# Patient Record
Sex: Female | Born: 1995
Health system: Southern US, Academic
[De-identification: ages and names within clinical notes are randomized; demographics above are authoritative.]

## PROBLEM LIST (undated history)

## (undated) ENCOUNTER — Encounter: Attending: Psychiatry | Primary: Psychiatry

## (undated) ENCOUNTER — Telehealth

## (undated) ENCOUNTER — Telehealth
Attending: Student in an Organized Health Care Education/Training Program | Primary: Student in an Organized Health Care Education/Training Program

## (undated) ENCOUNTER — Encounter

## (undated) ENCOUNTER — Ambulatory Visit: Attending: Addiction (Substance Use Disorder) | Primary: Addiction (Substance Use Disorder)

## (undated) ENCOUNTER — Ambulatory Visit

## (undated) ENCOUNTER — Telehealth
Attending: Pharmacist Clinician (PhC)/ Clinical Pharmacy Specialist | Primary: Pharmacist Clinician (PhC)/ Clinical Pharmacy Specialist

## (undated) ENCOUNTER — Encounter
Attending: Student in an Organized Health Care Education/Training Program | Primary: Student in an Organized Health Care Education/Training Program

## (undated) ENCOUNTER — Ambulatory Visit: Payer: MEDICAID | Attending: Family | Primary: Family

## (undated) ENCOUNTER — Encounter: Attending: Family Medicine | Primary: Family Medicine

## (undated) ENCOUNTER — Encounter: Attending: Family | Primary: Family

## (undated) ENCOUNTER — Telehealth: Attending: Family | Primary: Family

## (undated) ENCOUNTER — Ambulatory Visit: Payer: MEDICAID

## (undated) ENCOUNTER — Telehealth: Attending: Psychiatry | Primary: Psychiatry

## (undated) ENCOUNTER — Encounter
Attending: Pharmacist Clinician (PhC)/ Clinical Pharmacy Specialist | Primary: Pharmacist Clinician (PhC)/ Clinical Pharmacy Specialist

## (undated) ENCOUNTER — Encounter: Attending: Gastroenterology | Primary: Gastroenterology

## (undated) DIAGNOSIS — Z789 Other specified health status: Secondary | ICD-10-CM

## (undated) HISTORY — PX: NO PAST SURGERIES: SHX2092

---
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
## 2017-01-30 ENCOUNTER — Emergency Department
Admission: EM | Admit: 2017-01-30 | Discharge: 2017-01-30 | Disposition: A | Discharge status: Home / Self Care | Source: Intra-hospital

## 2017-01-30 MED ORDER — SULFAMETHOXAZOLE 800 MG-TRIMETHOPRIM 160 MG TABLET
ORAL_TABLET | Freq: Two times a day (BID) | ORAL | 0 refills | 0 days | Status: CP
Start: 2017-01-30 — End: 2017-02-09

## 2017-01-30 MED ORDER — IBUPROFEN 600 MG TABLET
ORAL_TABLET | Freq: Four times a day (QID) | ORAL | 0 refills | 0.00000 days | Status: CP | PRN
Start: 2017-01-30 — End: 2017-02-06

## 2017-02-02 ENCOUNTER — Emergency Department
Admission: EM | Admit: 2017-02-02 | Discharge: 2017-02-02 | Disposition: A | Discharge status: Home / Self Care | Source: Intra-hospital

## 2017-02-02 MED ORDER — DOXYCYCLINE HYCLATE 100 MG CAPSULE
ORAL_CAPSULE | Freq: Two times a day (BID) | ORAL | 0 refills | 0.00000 days | Status: CP
Start: 2017-02-02 — End: 2017-02-02

## 2017-02-02 MED ORDER — FLUCONAZOLE 200 MG TABLET: 200 mg | tablet | Freq: Once | 0 refills | 0 days | Status: AC

## 2017-02-02 MED ORDER — DOXYCYCLINE HYCLATE 100 MG CAPSULE: 100 mg | capsule | Freq: Two times a day (BID) | 0 refills | 0 days | Status: AC

## 2017-02-02 MED ORDER — FLUCONAZOLE 200 MG TABLET
ORAL_TABLET | Freq: Once | ORAL | 0 refills | 0.00000 days | Status: CP
Start: 2017-02-02 — End: 2017-02-02

## 2017-02-03 MED ORDER — DOXYCYCLINE HYCLATE 100 MG CAPSULE: 100 mg | capsule | 0 refills | 0 days

## 2017-02-03 MED ORDER — FLUCONAZOLE 200 MG TABLET
ORAL_TABLET | Freq: Every day | ORAL | 0 refills | 0 days | Status: CP
Start: 2017-02-03 — End: 2017-02-03

## 2017-02-03 MED ORDER — DOXYCYCLINE HYCLATE 100 MG CAPSULE
ORAL_CAPSULE | Freq: Two times a day (BID) | ORAL | 0 refills | 0.00000 days | Status: CP
Start: 2017-02-03 — End: 2018-02-03

## 2017-02-03 MED FILL — DOXYCYCLINE/100MG/CAP: DOXYCYCLINE/100MG/CAP | 10 days supply | Qty: 20 | Fill #0

## 2017-02-04 ENCOUNTER — Emergency Department
Admission: EM | Admit: 2017-02-04 | Discharge: 2017-02-04 | Disposition: A | Discharge status: Home / Self Care | Source: Intra-hospital

## 2017-03-29 ENCOUNTER — Emergency Department
Admission: EM | Admit: 2017-03-29 | Discharge: 2017-03-29 | Disposition: A | Discharge status: Home / Self Care | Source: Intra-hospital | Attending: Emergency Medicine | Admitting: Emergency Medicine

## 2017-03-29 MED ORDER — SULFAMETHOXAZOLE 800 MG-TRIMETHOPRIM 160 MG TABLET: 1 | tablet | Freq: Two times a day (BID) | 0 refills | 0 days | Status: AC

## 2017-03-29 MED ORDER — SULFAMETHOXAZOLE 800 MG-TRIMETHOPRIM 160 MG TABLET
ORAL_TABLET | Freq: Two times a day (BID) | ORAL | 0 refills | 0.00000 days | Status: CP
Start: 2017-03-29 — End: 2017-04-08

## 2019-11-01 ENCOUNTER — Other Ambulatory Visit: Payer: Self-pay

## 2019-11-01 ENCOUNTER — Observation Stay: Payer: Medicaid Other

## 2019-11-01 ENCOUNTER — Inpatient Hospital Stay
Admission: EM | Admit: 2019-11-01 | Discharge: 2019-11-03 | DRG: 832 | Discharge status: Against Medical Advice | Payer: Medicaid Other | Attending: Obstetrics and Gynecology | Admitting: Obstetrics and Gynecology

## 2019-11-01 ENCOUNTER — Encounter: Payer: Self-pay | Admitting: Obstetrics and Gynecology

## 2019-11-01 DIAGNOSIS — O99322 Drug use complicating pregnancy, second trimester: Secondary | ICD-10-CM

## 2019-11-01 DIAGNOSIS — O99012 Anemia complicating pregnancy, second trimester: Secondary | ICD-10-CM | POA: Diagnosis present

## 2019-11-01 DIAGNOSIS — N1330 Unspecified hydronephrosis: Secondary | ICD-10-CM | POA: Diagnosis not present

## 2019-11-01 DIAGNOSIS — R10A1 Flank pain, right side: Secondary | ICD-10-CM | POA: Diagnosis present

## 2019-11-01 DIAGNOSIS — N390 Urinary tract infection, site not specified: Secondary | ICD-10-CM | POA: Diagnosis present

## 2019-11-01 DIAGNOSIS — E876 Hypokalemia: Secondary | ICD-10-CM | POA: Diagnosis not present

## 2019-11-01 DIAGNOSIS — R7989 Other specified abnormal findings of blood chemistry: Secondary | ICD-10-CM | POA: Diagnosis present

## 2019-11-01 DIAGNOSIS — N3000 Acute cystitis without hematuria: Secondary | ICD-10-CM | POA: Diagnosis not present

## 2019-11-01 DIAGNOSIS — Z5329 Procedure and treatment not carried out because of patient's decision for other reasons: Secondary | ICD-10-CM | POA: Diagnosis present

## 2019-11-01 DIAGNOSIS — O99282 Endocrine, nutritional and metabolic diseases complicating pregnancy, second trimester: Secondary | ICD-10-CM | POA: Diagnosis present

## 2019-11-01 DIAGNOSIS — F17210 Nicotine dependence, cigarettes, uncomplicated: Secondary | ICD-10-CM | POA: Diagnosis present

## 2019-11-01 DIAGNOSIS — D649 Anemia, unspecified: Secondary | ICD-10-CM | POA: Diagnosis present

## 2019-11-01 DIAGNOSIS — R17 Unspecified jaundice: Secondary | ICD-10-CM | POA: Diagnosis not present

## 2019-11-01 DIAGNOSIS — M545 Low back pain: Secondary | ICD-10-CM | POA: Diagnosis present

## 2019-11-01 DIAGNOSIS — O2342 Unspecified infection of urinary tract in pregnancy, second trimester: Principal | ICD-10-CM | POA: Diagnosis present

## 2019-11-01 DIAGNOSIS — Z3A26 26 weeks gestation of pregnancy: Secondary | ICD-10-CM

## 2019-11-01 DIAGNOSIS — F1123 Opioid dependence with withdrawal: Secondary | ICD-10-CM | POA: Diagnosis present

## 2019-11-01 DIAGNOSIS — F1990 Other psychoactive substance use, unspecified, uncomplicated: Secondary | ICD-10-CM

## 2019-11-01 DIAGNOSIS — R109 Unspecified abdominal pain: Secondary | ICD-10-CM | POA: Diagnosis present

## 2019-11-01 DIAGNOSIS — O26899 Other specified pregnancy related conditions, unspecified trimester: Secondary | ICD-10-CM | POA: Diagnosis present

## 2019-11-01 DIAGNOSIS — O99891 Other specified diseases and conditions complicating pregnancy: Secondary | ICD-10-CM | POA: Diagnosis present

## 2019-11-01 DIAGNOSIS — R945 Abnormal results of liver function studies: Secondary | ICD-10-CM | POA: Diagnosis present

## 2019-11-01 DIAGNOSIS — O99332 Smoking (tobacco) complicating pregnancy, second trimester: Secondary | ICD-10-CM | POA: Diagnosis present

## 2019-11-01 DIAGNOSIS — Z20822 Contact with and (suspected) exposure to covid-19: Secondary | ICD-10-CM | POA: Diagnosis present

## 2019-11-01 DIAGNOSIS — Z3490 Encounter for supervision of normal pregnancy, unspecified, unspecified trimester: Secondary | ICD-10-CM

## 2019-11-01 HISTORY — DX: Other specified health status: Z78.9

## 2019-11-01 LAB — CBC
HCT: 29.4 % — ABNORMAL LOW (ref 36.0–46.0)
Hemoglobin: 10 g/dL — ABNORMAL LOW (ref 12.0–15.0)
MCH: 27.9 pg (ref 26.0–34.0)
MCHC: 34 g/dL (ref 30.0–36.0)
MCV: 81.9 fL (ref 80.0–100.0)
Platelets: 292 10*3/uL (ref 150–400)
RBC: 3.59 MIL/uL — ABNORMAL LOW (ref 3.87–5.11)
RDW: 15.5 % (ref 11.5–15.5)
WBC: 13.6 10*3/uL — ABNORMAL HIGH (ref 4.0–10.5)
nRBC: 0 % (ref 0.0–0.2)

## 2019-11-01 LAB — RESPIRATORY PANEL BY RT PCR (FLU A&B, COVID)
Influenza A by PCR: NEGATIVE
Influenza B by PCR: NEGATIVE
SARS Coronavirus 2 by RT PCR: NEGATIVE

## 2019-11-01 LAB — COMPREHENSIVE METABOLIC PANEL
ALT: 95 U/L — ABNORMAL HIGH (ref 0–44)
AST: 113 U/L — ABNORMAL HIGH (ref 15–41)
Albumin: 2.4 g/dL — ABNORMAL LOW (ref 3.5–5.0)
Alkaline Phosphatase: 178 U/L — ABNORMAL HIGH (ref 38–126)
Anion gap: 10 (ref 5–15)
BUN: <5 mg/dL — ABNORMAL LOW (ref 6–20)
CO2: 23 mmol/L (ref 22–32)
Calcium: 8.3 mg/dL — ABNORMAL LOW (ref 8.9–10.3)
Chloride: 104 mmol/L (ref 98–111)
Creatinine, Ser: 0.47 mg/dL (ref 0.44–1.00)
GFR calc Af Amer: >60 mL/min (ref >60)
GFR calc non Af Amer: >60 mL/min (ref >60)
Glucose, Bld: 93 mg/dL (ref 70–99)
Potassium: 3.4 mmol/L — ABNORMAL LOW (ref 3.5–5.1)
Sodium: 137 mmol/L (ref 135–145)
Total Bilirubin: 6.8 mg/dL — ABNORMAL HIGH (ref 0.3–1.2)
Total Protein: 6.1 g/dL — ABNORMAL LOW (ref 6.5–8.1)

## 2019-11-01 LAB — URINALYSIS, COMPLETE (UACMP) WITH MICROSCOPIC
Glucose, UA: NEGATIVE mg/dL
Hgb urine dipstick: NEGATIVE
Ketones, ur: NEGATIVE mg/dL
Nitrite: NEGATIVE
Protein, ur: NEGATIVE mg/dL
Specific Gravity, Urine: 1.009 (ref 1.005–1.030)
WBC, UA: >50 WBC/hpf — ABNORMAL HIGH (ref 0–5)
pH: 6 (ref 5.0–8.0)

## 2019-11-01 LAB — URINE DRUG SCREEN, QUALITATIVE (ARMC ONLY)
Amphetamines, Ur Screen: POSITIVE — AB
Barbiturates, Ur Screen: NOT DETECTED
Benzodiazepine, Ur Scrn: NOT DETECTED
Cannabinoid 50 Ng, Ur ~~LOC~~: NOT DETECTED
Cocaine Metabolite,Ur ~~LOC~~: NOT DETECTED
MDMA (Ecstasy)Ur Screen: NOT DETECTED
Methadone Scn, Ur: POSITIVE — AB
Opiate, Ur Screen: NOT DETECTED
Phencyclidine (PCP) Ur S: NOT DETECTED
Tricyclic, Ur Screen: NOT DETECTED

## 2019-11-01 LAB — RAPID HIV SCREEN (HIV 1/2 AB+AG)
HIV 1/2 Antibodies: NONREACTIVE
HIV-1 P24 Antigen - HIV24: NONREACTIVE

## 2019-11-01 LAB — BILIRUBIN, FRACTIONATED(TOT/DIR/INDIR)
Bilirubin, Direct: 4.3 mg/dL — ABNORMAL HIGH (ref 0.0–0.2)
Indirect Bilirubin: 2.7 mg/dL — ABNORMAL HIGH (ref 0.3–0.9)
Total Bilirubin: 7 mg/dL — ABNORMAL HIGH (ref 0.3–1.2)

## 2019-11-01 LAB — PROTIME-INR
INR: 1.1 (ref 0.8–1.2)
Prothrombin Time: 13.9 seconds (ref 11.4–15.2)

## 2019-11-01 LAB — LIPASE, BLOOD: Lipase: 19 U/L (ref 11–51)

## 2019-11-01 LAB — AMYLASE: Amylase: 41 U/L (ref 28–100)

## 2019-11-01 MED ORDER — FERROUS SULFATE 325 (65 FE) MG PO TABS
325.0000 mg | ORAL_TABLET | Freq: Every day | ORAL | Status: DC
  Administered 2019-11-02 – 2019-11-03 (×2): 325 mg via ORAL
  Filled 2019-11-01 (×2): qty 1

## 2019-11-01 MED ORDER — SODIUM CHLORIDE 0.9 % IV SOLN
INTRAVENOUS | Status: DC
  Administered 2019-11-03: 1000 mL via INTRAVENOUS

## 2019-11-01 MED ORDER — CYCLOBENZAPRINE HCL 10 MG PO TABS
5.0000 mg | ORAL_TABLET | Freq: Three times a day (TID) | ORAL | Status: DC | PRN
  Administered 2019-11-01 – 2019-11-03 (×5): 5 mg via ORAL
  Filled 2019-11-01 (×6): qty 0.5

## 2019-11-01 MED ORDER — LACTATED RINGERS IV BOLUS
1000.0000 mL | Freq: Once | INTRAVENOUS | Status: AC
  Administered 2019-11-01: 1000 mL via INTRAVENOUS

## 2019-11-01 MED ORDER — ONDANSETRON HCL 4 MG/2ML IJ SOLN: 4.0000 mg | Freq: Three times a day (TID) | INTRAMUSCULAR | Status: DC | PRN

## 2019-11-01 MED ORDER — TAMSULOSIN HCL 0.4 MG PO CAPS
0.4000 mg | ORAL_CAPSULE | Freq: Every day | ORAL | Status: DC
  Administered 2019-11-01 – 2019-11-03 (×3): 0.4 mg via ORAL
  Filled 2019-11-01 (×3): qty 1

## 2019-11-01 MED ORDER — METHADONE HCL 5 MG PO TABS
32.5000 mg | ORAL_TABLET | Freq: Every day | ORAL | Status: DC
  Administered 2019-11-02 – 2019-11-03 (×2): 32.5 mg via ORAL
  Filled 2019-11-01 (×2): qty 1

## 2019-11-01 MED ORDER — SODIUM CHLORIDE 0.9 % IV SOLN
1.0000 g | INTRAVENOUS | Status: DC
  Administered 2019-11-01 – 2019-11-03 (×3): 1 g via INTRAVENOUS
  Filled 2019-11-01: qty 1
  Filled 2019-11-01: qty 10
  Filled 2019-11-01: qty 1
  Filled 2019-11-01: qty 10

## 2019-11-01 MED ORDER — ACETAMINOPHEN 325 MG PO TABS: 650.0000 mg | ORAL_TABLET | ORAL | Status: DC | PRN

## 2019-11-01 MED ORDER — POTASSIUM CHLORIDE CRYS ER 10 MEQ PO TBCR
20.0000 meq | EXTENDED_RELEASE_TABLET | Freq: Once | ORAL | Status: AC
  Administered 2019-11-01: 20 meq via ORAL
  Filled 2019-11-01: qty 2

## 2019-11-01 MED ORDER — NICOTINE 21 MG/24HR TD PT24
21.0000 mg | MEDICATED_PATCH | Freq: Every day | TRANSDERMAL | Status: DC
  Administered 2019-11-01 – 2019-11-02 (×2): 21 mg via TRANSDERMAL
  Filled 2019-11-01 (×2): qty 1

## 2019-11-01 MED ORDER — SODIUM CHLORIDE 0.9 % IV SOLN
1.0000 g | Freq: Once | INTRAVENOUS | Status: DC
  Filled 2019-11-01: qty 10

## 2019-11-01 MED ORDER — PRENATAL MULTIVITAMIN CH
1.0000 | ORAL_TABLET | Freq: Every day | ORAL | Status: DC
  Administered 2019-11-02 – 2019-11-03 (×2): 1 via ORAL
  Filled 2019-11-01 (×2): qty 1

## 2019-11-01 NOTE — Progress Notes (Signed)
Patient went down to MRI to have MRCP performed as recommended by GI consult. She reports that almost immediately, she became claustrophobic and the scan was abandoned. She reports that she felt incredibly anxious. Discussed that we have the option to provide premedication with something that will help her to feel more calm and sleepy. She prefers to defer the scan for now. If as her labs begin to result, GI still feels that the scan would be helpful, she is open to trying again with premedication.   Discussed that we would try to avoid benzodiazepenes for premedication, given her recent IV drug use and stated desire to get and stay clean. Could use diphenhydramine 50mg  IV and/or Phenergan 25mg  IV for premedication.   , 11/01/2019 9:07 PM

## 2019-11-01 NOTE — Consult Note (Signed)
Medical Consultation   Glenda Schwartz  PJK:[id number]  DOB: 1995-08-18  DOA: 11/01/2019  PCP: Patient, No Pcp Per  Outpatient Specialists:    Requesting physician: OB/Gyn: CNM, Ian Bushman  Reason for consultation: -Right flank pain, possible UTI, jaundice  History of Present Illness: Glenda Schwartz is an 24 y.o. female tobacco abuse, IV drug abuse, methadone use, 25-week pregnancy, who presents with right flan pain and jaundice.  Pt is 25 weeks of pregnancy.  Patient states that she has been having right-sided flank pain in the past 3 days, which is constant, sharp, 6 out of 10 severity, constant, nonradiating.  She has had dark urine, but no symptoms of UTI.  No hematuria.  She has nausea and vomited once yesterday, but no vomiting today.  She had loose stool bowel movement yesterday, but no diarrhea today.  Denies abdominal pain.  Patient does not have chest pain, cough, shortness of breath.  No fever or chills.  No vaginal bleeding or vaginal discharge.  She feels like her baby is normal. She state that her stool is lighter in color recently.  Her eyes look yellow. She has hx of IV drug use.  She states that she goes to [facility] treatment center for methadone treatment.  Lab: WBC 13.6, urinalysis (cloudy appearance, large amount of leukocyte, many bacteria, WBC>59), lipase 19, UDS positive for methadone and amphetamine, amylase 41, negative HIV antibody, pending COVID-19 PCR, potassium 3.4, renal function okay, abnormal liver function (ALP 178, AST 113, ALT 95, total bilirubin 7.0, direct bilirubin 4.3), platelet 292, temperature normal, blood pressure 108/74, heart rate 86, RR 18.  # US-renal:  1. Moderate hydronephrosis on the right. Slight hydronephrosis on the left. Proximal ureterectasis noted on the right. No focal obstructing ureteral lesions evident. Fullness of the collecting systems in this circumstance could be due to impression from the intrauterine  gestation. Obstructing calculus, particular on the right, cannot be excluded on this study. 2.  Nonobstructing 7 mm calculus mid to lower right kidney.  # US-RUQ: Negative.  No hepatobiliary abnormality identified.  Review of Systems:  General: no fevers, chills, no changes in body weight, no changes in appetite. Has yellow eyes Skin: no rash HEENT: no blurry vision, hearing changes or sore throat Pulm: no dyspnea, coughing, wheezing CV: no chest pain, palpitations, shortness of breath Abd: had nausea, vomiting and loose stool bowel movement, no abdominal pain or constipation GU: no dysuria, hematuria, polyuria Ext: no arthralgias, myalgias Musculoskeletal: Has right flank pain Neuro: no weakness, numbness, or tingling  Past Medical History: Past Medical History:  Diagnosis Date  . Medical history non-contributory     Past Surgical History: Past Surgical History:  Procedure Laterality Date  . NO PAST SURGERIES       Allergies:  No Known Allergies   Social History:  reports that she has been smoking cigarettes. She has been smoking about 0.50 packs per day. She has never used smokeless tobacco. No history on file for alcohol and drug.   Family History: Family History  Problem Relation Age of Onset  . Anxiety disorder Mother   . Anxiety disorder Father    Physical Exam: Vitals:   11/01/19 1107 11/01/19 1119  BP: 108/74   Pulse: 86   Resp: 18   Temp: 98 F (36.7 C)   TempSrc: Oral   Weight:  59 kg  Height:  5\' 2"  (1.575 m)  General: Not in acute distress HEENT: PERRL, EOMI, has scleral icterus, No JVD or bruit Cardiac: S1/S2, RRR, No murmurs, gallops or rubs Pulm: No rales, wheezing, rhonchi or rubs. Abd: Soft, nontender, no rebound pain, no organomegaly, BS present. 25 week pregnancy. Ext: No edema. 2+DP/PT pulse bilaterally GU: has right CVA tenderness Musculoskeletal: No joint deformities, erythema, or stiffness, ROM full Skin: No rashes.  Neuro:  Alert and oriented X3, cranial nerves II-XII grossly intact, moves all extremities normally Psych: Patient is not psychotic, no suicidal or hemocidal ideation.   Data reviewed:  I have personally reviewed following labs and imaging studies Labs:  CBC: Recent Labs  Lab 11/01/19 1144  WBC 13.6*  HGB 10.0*  HCT 29.4*  MCV 81.9  PLT 292    Basic Metabolic Panel: Recent Labs  Lab 11/01/19 1144  NA 137  K 3.4*  CL 104  CO2 23  GLUCOSE 93  BUN <5*  CREATININE 0.47  CALCIUM 8.3*   GFR Estimated Creatinine Clearance: 84.3 mL/min (by C-G formula based on SCr of 0.47 mg/dL). Liver Function Tests: Recent Labs  Lab 11/01/19 1143 11/01/19 1144  AST  --  113*  ALT  --  95*  ALKPHOS  --  178*  BILITOT 7.0* 6.8*  PROT  --  6.1*  ALBUMIN  --  2.4*   Recent Labs  Lab 11/01/19 1143  LIPASE 19  AMYLASE 41   No results for input(s): AMMONIA in the last 168 hours. Coagulation profile Recent Labs  Lab 11/01/19 1144  INR 1.1    Cardiac Enzymes: No results for input(s): CKTOTAL, CKMB, CKMBINDEX, TROPONINI in the last 168 hours. BNP: Invalid input(s): POCBNP CBG: No results for input(s): GLUCAP in the last 168 hours. D-Dimer No results for input(s): DDIMER in the last 72 hours. Hgb A1c No results for input(s): HGBA1C in the last 72 hours. Lipid Profile No results for input(s): CHOL, HDL, LDLCALC, TRIG, CHOLHDL, LDLDIRECT in the last 72 hours. Thyroid function studies No results for input(s): TSH, T4TOTAL, T3FREE, THYROIDAB in the last 72 hours.  Invalid input(s): FREET3 Anemia work up No results for input(s): VITAMINB12, FOLATE, FERRITIN, TIBC, IRON, RETICCTPCT in the last 72 hours. Urinalysis    Component Value Date/Time   COLORURINE AMBER (A) 11/01/2019 1143   APPEARANCEUR CLOUDY (A) 11/01/2019 1143   LABSPEC 1.009 11/01/2019 1143   PHURINE 6.0 11/01/2019 1143   GLUCOSEU NEGATIVE 11/01/2019 1143   HGBUR NEGATIVE 11/01/2019 1143   BILIRUBINUR MODERATE (A)  11/01/2019 1143   KETONESUR NEGATIVE 11/01/2019 1143   PROTEINUR NEGATIVE 11/01/2019 1143   NITRITE NEGATIVE 11/01/2019 1143   LEUKOCYTESUR LARGE (A) 11/01/2019 1143     Microbiology Recent Results (from the past 240 hour(s))  Respiratory Panel by RT PCR (Flu A&B, Covid) - Nasopharyngeal Swab     Status: None   Collection Time: 11/01/19  2:56 PM   Specimen: Nasopharyngeal Swab  Result Value Ref Range Status   SARS Coronavirus 2 by RT PCR NEGATIVE NEGATIVE Final    Comment: (NOTE) SARS-CoV-2 target nucleic acids are NOT DETECTED. The SARS-CoV-2 RNA is generally detectable in upper respiratoy specimens during the acute phase of infection. The lowest concentration of SARS-CoV-2 viral copies this assay can detect is 131 copies/mL. A negative result does not preclude SARS-Cov-2 infection and should not be used as the sole basis for treatment or other patient management decisions. A negative result may occur with  improper specimen collection/handling, submission of specimen other than nasopharyngeal swab, presence of viral  mutation(s) within the areas targeted by this assay, and inadequate number of viral copies (<131 copies/mL). A negative result must be combined with clinical observations, patient history, and epidemiological information. The expected result is Negative. Fact Sheet for Patients:  https://www.moore.com/ Fact Sheet for Healthcare Providers:  https://www.young.biz/ This test is not yet ap proved or cleared by the [place] FDA and  has been authorized for detection and/or diagnosis of SARS-CoV-2 by FDA under an Emergency Use Authorization (EUA). This EUA will remain  in effect (meaning this test can be used) for the duration of the COVID-19 declaration under Section 564(b)(1) of the Act, 21 U.S.C. section 360bbb-3(b)(1), unless the authorization is terminated or revoked sooner.    Influenza A by PCR NEGATIVE NEGATIVE Final     Influenza B by PCR NEGATIVE NEGATIVE Final    Comment: (NOTE) The Xpert Xpress SARS-CoV-2/FLU/RSV assay is intended as an aid in  the diagnosis of influenza from Nasopharyngeal swab specimens and  should not be used as a sole basis for treatment. Nasal washings and  aspirates are unacceptable for Xpert Xpress SARS-CoV-2/FLU/RSV  testing. Fact Sheet for Patients: https://www.moore.com/ Fact Sheet for Healthcare Providers: https://www.young.biz/ This test is not yet approved or cleared by the [place] FDA and  has been authorized for detection and/or diagnosis of SARS-CoV-2 by  FDA under an Emergency Use Authorization (EUA). This EUA will remain  in effect (meaning this test can be used) for the duration of the  Covid-19 declaration under Section 564(b)(1) of the Act, 21  U.S.C. section 360bbb-3(b)(1), unless the authorization is  terminated or revoked. Performed at [facility], [address] [place], [place] [address]        Inpatient Medications:   Scheduled Meds: . [START ON 11/02/2019] ferrous sulfate  325 mg Oral Q breakfast  . [START ON 11/02/2019] methadone  32.5 mg Oral Daily  . [START ON 11/02/2019] prenatal multivitamin  1 tablet Oral Q1200  . tamsulosin  0.4 mg Oral Daily   Continuous Infusions: . sodium chloride 125 mL/hr at 11/01/19 1442  . cefTRIAXone (ROCEPHIN)  IV 1 g (11/01/19 1446)     Radiological Exams on Admission: US OB Comp + 14 Wk  Result Date: 11/01/2019 CLINICAL DATA:  Substance abuse complicating 2nd trimester pregnancy. Jaundice. EXAM: OBSTETRICAL ULTRASOUND >14 WKS FINDINGS: Number of Fetuses: 1 Heart Rate:  133 bpm Movement: Yes Presentation: Breech Previa: No Placental Location: Anterior Amniotic Fluid (Subjective): Within normal limits Amniotic Fluid (Objective): Vertical pocket = 7.0cm FETAL BIOMETRY BPD: 6.5cm 26w 1d HC:   24.5cm 26w 4d AC:   21.9cm 26w 3d FL:   4.8cm 25w 6d Current Mean  GA: 26w 0d [place] EDC: 02/07/2020 Assigned GA:  25w 6d Assigned EDC: 02/08/2020 FETAL ANATOMY Lateral Ventricles: Appears normal Thalami/CSP: Appears normal Posterior Fossa:  Appears normal Nuchal Region: Appears normal   NFT= N/A > 20 WKS Upper Lip: Appears normal Spine: Appears normal 4 Chamber Heart on Left: Appears normal LVOT: Appears normal RVOT: Appears normal Stomach on Left: Appears normal 3 Vessel Cord: Appears normal Cord Insertion site: Appears normal Kidneys: Appears normal Bladder: Appears normal Extremities: Appears normal Sex: Female Maternal Findings: Cervix:  3.6 cm TA IMPRESSION: Assigned GA currently 25 weeks 6 days.  Appropriate fetal growth. Unremarkable anatomic survey.  No fetal anomalies identified. Electronically Signed   By: Danae Orleans M.D.   On: 11/01/2019 14:42   US RENAL  Result Date: 11/01/2019 CLINICAL DATA:  Flank pain.  Early third trimester gestation  EXAM: RENAL / URINARY TRACT ULTRASOUND COMPLETE COMPARISON:  None. FINDINGS: Right Kidney: Renal measurements: 11.6 x 5.4 x 5.3 cm = volume: 174.1 mL . Echogenicity and renal cortical thickness are within normal limits. No mass or perinephric fluid visualized. There is moderate hydronephrosis and proximal ureterectasis on the right. There is a nonobstructing calculus in the mid to lower right kidney measuring 7 mm. No ureteral calculus seen in visualized portions of right ureter. Left Kidney: Renal measurements: 12.0 x 4.7 x 5.0 cm = volume: 145.7 mL. Echogenicity and renal cortical thickness are within normal limits. No mass or perinephric fluid visualized. There is mild hydronephrosis on the left without ureterectasis. No sonographically demonstrable calculus. Bladder: Appears normal for degree of bladder distention. Flow from each distal ureter cannot be delineated on this study. Other: None. IMPRESSION: 1. Moderate hydronephrosis on the right. Slight hydronephrosis on the left. Proximal ureterectasis noted on the right. No focal  obstructing ureteral lesions evident. Fullness of the collecting systems in this circumstance could be due to impression from the intrauterine gestation. Obstructing calculus, particular on the right, cannot be excluded on this study. 2.  Nonobstructing 7 mm calculus mid to lower right kidney. 3.  Study otherwise unremarkable. These results will be called to the ordering clinician or representative by the Radiologist Assistant, and communication documented in the PACS or [employer]. Electronically Signed   By: Bretta Bang III M.D.   On: 11/01/2019 12:22   US Abdomen Limited RUQ  Result Date: 11/01/2019 CLINICAL DATA:  Jaundice. EXAM: ULTRASOUND ABDOMEN LIMITED RIGHT UPPER QUADRANT COMPARISON:  None. FINDINGS: Gallbladder: No gallstones or wall thickening visualized. No sonographic Murphy sign noted by sonographer. Common bile duct: Diameter: 3 mm, within normal limits. Liver: No focal lesion identified. Within normal limits in parenchymal echogenicity. Portal vein is patent on color Doppler imaging with normal direction of blood flow towards the liver. Other: None. IMPRESSION: Negative.  No hepatobiliary abnormality identified. Electronically Signed   By: Danae Orleans M.D.   On: 11/01/2019 14:38    Impression/Recommendations Principal Problem:   UTI (urinary tract infection) Active Problems:   Abnormal LFTs   IVDU (intravenous drug user)   Hypokalemia   Right flank pain   Hydronephrosis   Normocytic anemia   Pregnancy  UTI (urinary tract infection): -IV rocephin -f/u Blood and urine culture  Right flank pain due to hydronephrosis: renal US showed moderate hydronephrosis on the right. Slight hydronephrosis on the left. Proximal ureterectasis noted on the right. Obstructing calculus, particular on the right, cannot be excluded on this study. Nonobstructing 7 mm calculus mid to lower right kidney. -urology is consulted by primary Ob team -pt is on home methadone  Abnormal LFTs:  Etiology is not clear. US-RUQ is negative.  HELLP syndrome is a differential diagnosis, but pt dose not have typical presentation. Platelet is normal.  Dr. Tobi Bastos of GI is consulted.  -Need to f/u Dr. Johnney Killian recommendations  Hx of IVDU (intravenous drug user): -continue home methadone  Hypokalemia: K 3.4 -repleted  Normocytic anemia: -On iron supplement  Pregnancy: pt is [redacted]W[redacted]D pregancy.  No vaginal bleeding, abdominal pain or vaginal discharge. - management per primary OB team   Thank you for this consultation.  Our [facility] hospitalist team will follow the patient with you.   Time Spent: 35 min  Lorretta Harp M.D. [facility] 11/01/2019, 6:10 PM

## 2019-11-01 NOTE — Consult Note (Addendum)
I was contacted by Genia Del regarding this patient.  She asked me to make a recommendation in regards to hydronephrosis and right lower quadrant pain.  I reviewed the chart and discussed the patient with her.  24 year old female with a history of IV drug abuse about [redacted] weeks pregnant.  She presented with right lower back pain of 2 days duration of about 4/10 severity.  She noted some dark-colored urine but no significant voiding complaints.  No dysuria.  She was found to have jaundice.  Urinalysis showed large leukocyte, 6-10 RBC, greater than 50 WBC.  She was started on ceftriaxone and cultures were sent.  She has no systemic complaints.  She has been afebrile vital signs stable.  Gastroenterology is involved given her abnormal LFTs.  She was unable to tolerate an MRI.  There is no obvious abnormality of the liver or gallbladder on ultrasound.  However, renal ultrasound showed moderate right hydronephrosis and slight hydronephrosis on the left.  There was a 7 mm nonobstructing right lower pole stone.  Hydronephrosis secondary to gestation versus a possible stone but no stone was seen in the ureter.  Certainly reasonable to treat conservatively given that she is afebrile and vitals are stable.  However, if she develops a fever, concerning vital signs, worsening creatinine, or pain does not improve, recommend low-dose CT renal stone protocol.  If there is an obstructing stone present, please contact urologist on-call.  No intervention necessary for a nonobstructing calculus in the acute period.

## 2019-11-01 NOTE — H&P (Addendum)
Glenda Schwartz is a 24 y.o. female. She is at [redacted]w[redacted]d gestation. No LMP recorded. Patient is pregnant. Estimated Date of Delivery: 02/08/20  Prenatal care site:  [facility]--Unassigned patient  Chief complaint: right sided lower back pain Location: right lower back Onset/timing: two days ago Duration: constant, but waxes and wanes in  Severity: 4/10 at baseline, 7-8/10 at its worst Aggravating or alleviating conditions: none Associated signs/symptoms: sludgy discolored urine, whites of eyes are yellow Context: Glenda Schwartz reports right sided lower back pain that started a few days ago and has gradually gotten worse. She reports that it is now constantly present, but waxes and wanes in intensity. She has not taken any OTC medication for the pain. She reports that she has also noticed that the whites of her eyes have been yellow. She also reports that her urine has been a dark color and a thick consistency. She reports some loose stools that have been lighter in color than usual. Of note, she has a history of substance use and reports IV drug use as recently as last week with heroin and fentanyl.   S: Resting comfortably.   She reports:  -active fetal movement -no leakage of fluid -no vaginal bleeding -no contractions  Maternal Medical History:   Past Medical History:  Diagnosis Date  . Medical history non-contributory     Past Surgical History:  Procedure Laterality Date  . NO PAST SURGERIES      No Known Allergies  Prior to Admission medications   Medication Sig Start Date End Date Taking? Authorizing Provider  methadone (DOLOPHINE) 10 MG tablet Take 32.5 mg by mouth daily.   Yes [provider]     Social History: She  reports that she has been smoking cigarettes. She has been smoking about 0.50 packs per day. She has never used smokeless tobacco.  Family History: family history is not on file.   Review of Systems: A full  review of systems was performed and negative except as noted in the HPI.     O:  BP 108/74 (BP Location: Left Arm)   Pulse 86   Temp 98 F (36.7 C) (Oral)   Resp 18   Ht 5\' 2"  (1.575 m)   Wt 59 kg   BMI 23.78 kg/m  Results for orders placed or performed during the hospital encounter of 11/01/19 (from the past 48 hour(s))  Urine Drug Screen, Qualitative ([facility] only)   Collection Time: 11/01/19 11:43 AM  Result Value Ref Range   Tricyclic, Ur Screen NONE DETECTED NONE DETECTED   Amphetamines, Ur Screen POSITIVE (A) NONE DETECTED   MDMA (Ecstasy)Ur Screen NONE DETECTED NONE DETECTED   Cocaine Metabolite,Ur [place] NONE DETECTED NONE DETECTED   Opiate, Ur Screen NONE DETECTED NONE DETECTED   Phencyclidine (PCP) Ur S NONE DETECTED NONE DETECTED   Cannabinoid 50 Ng, Ur [place] NONE DETECTED NONE DETECTED   Barbiturates, Ur Screen NONE DETECTED NONE DETECTED   Benzodiazepine, Ur Scrn NONE DETECTED NONE DETECTED   Methadone Scn, Ur POSITIVE (A) NONE DETECTED  Urinalysis, Complete w Microscopic   Collection Time: 11/01/19 11:43 AM  Result Value Ref Range   Color, Urine AMBER (A) YELLOW   APPearance CLOUDY (A) CLEAR   Specific Gravity, Urine 1.009 1.005 - 1.030   pH 6.0 5.0 - 8.0   Glucose, UA NEGATIVE NEGATIVE mg/dL   Hgb urine dipstick NEGATIVE NEGATIVE   Bilirubin Urine MODERATE (A) NEGATIVE   Ketones, ur NEGATIVE NEGATIVE mg/dL  Protein, ur NEGATIVE NEGATIVE mg/dL   Nitrite NEGATIVE NEGATIVE   Leukocytes,Ua LARGE (A) NEGATIVE   RBC / HPF 6-10 0 - 5 RBC/hpf   WBC, UA >50 (H) 0 - 5 WBC/hpf   Bacteria, UA MANY (A) NONE SEEN   Squamous Epithelial / LPF 0-5 0 - 5   WBC Clumps PRESENT    Mucus PRESENT   Bilirubin, fractionated(tot/dir/indir)   Collection Time: 11/01/19 11:43 AM  Result Value Ref Range   Total Bilirubin 7.0 (H) 0.3 - 1.2 mg/dL   Bilirubin, Direct 4.3 (H) 0.0 - 0.2 mg/dL   Indirect Bilirubin 2.7 (H) 0.3 - 0.9 mg/dL  Amylase   Collection Time: 11/01/19 11:43 AM  Result  Value Ref Range   Amylase 41 28 - 100 U/L  Lipase, blood   Collection Time: 11/01/19 11:43 AM  Result Value Ref Range   Lipase 19 11 - 51 U/L  Comprehensive metabolic panel   Collection Time: 11/01/19 11:44 AM  Result Value Ref Range   Sodium 137 135 - 145 mmol/L   Potassium 3.4 (L) 3.5 - 5.1 mmol/L   Chloride 104 98 - 111 mmol/L   CO2 23 22 - 32 mmol/L   Glucose, Bld 93 70 - 99 mg/dL   BUN <5 (L) 6 - 20 mg/dL   Creatinine, Ser 0.47 0.44 - 1.00 mg/dL   Calcium 8.3 (L) 8.9 - 10.3 mg/dL   Total Protein 6.1 (L) 6.5 - 8.1 g/dL   Albumin 2.4 (L) 3.5 - 5.0 g/dL   AST 113 (H) 15 - 41 U/L   ALT 95 (H) 0 - 44 U/L   Alkaline Phosphatase 178 (H) 38 - 126 U/L   Total Bilirubin 6.8 (H) 0.3 - 1.2 mg/dL   GFR calc non Af Amer >60 >60 mL/min   GFR calc Af Amer >60 >60 mL/min   Anion gap 10 5 - 15  Protime-INR   Collection Time: 11/01/19 11:44 AM  Result Value Ref Range   Prothrombin Time 13.9 11.4 - 15.2 seconds   INR 1.1 0.8 - 1.2  CBC   Collection Time: 11/01/19 11:44 AM  Result Value Ref Range   WBC 13.6 (H) 4.0 - 10.5 K/uL   RBC 3.59 (L) 3.87 - 5.11 MIL/uL   Hemoglobin 10.0 (L) 12.0 - 15.0 g/dL   HCT 29.4 (L) 36.0 - 46.0 %   MCV 81.9 80.0 - 100.0 fL   MCH 27.9 26.0 - 34.0 pg   MCHC 34.0 30.0 - 36.0 g/dL   RDW 15.5 11.5 - 15.5 %   Platelets 292 150 - 400 K/uL   nRBC 0.0 0.0 - 0.2 %  Rapid HIV screen [facility] L&D dept ONLY)   Collection Time: 11/01/19  1:31 PM  Result Value Ref Range   HIV-1 P24 Antigen - HIV24 NON REACTIVE NON REACTIVE   HIV 1/2 Antibodies NON REACTIVE NON REACTIVE   Interpretation (HIV Ag Ab)      A non reactive test result means that HIV 1 or HIV 2 antibodies and HIV 1 p24 antigen were not detected in the specimen.     US OB Comp + 14 Wk  Result Date: 11/01/2019 CLINICAL DATA:  Substance abuse complicating 2nd trimester pregnancy. Jaundice. EXAM: OBSTETRICAL ULTRASOUND >14 WKS FINDINGS: Number of Fetuses: 1 Heart Rate:  133 bpm Movement: Yes Presentation:  Breech Previa: No Placental Location: Anterior Amniotic Fluid (Subjective): Within normal limits Amniotic Fluid (Objective): Vertical pocket = 7.0cm FETAL BIOMETRY BPD: 6.5cm 26w 1d HC:  24.5cm 26w 4d AC:   21.9cm 26w 3d FL:   4.8cm 25w 6d Current Mean GA: 26w 0d [place] EDC: 02/07/2020 Assigned GA:  25w 6d Assigned EDC: 02/08/2020 FETAL ANATOMY Lateral Ventricles: Appears normal Thalami/CSP: Appears normal Posterior Fossa:  Appears normal Nuchal Region: Appears normal   NFT= N/A > 20 WKS Upper Lip: Appears normal Spine: Appears normal 4 Chamber Heart on Left: Appears normal LVOT: Appears normal RVOT: Appears normal Stomach on Left: Appears normal 3 Vessel Cord: Appears normal Cord Insertion site: Appears normal Kidneys: Appears normal Bladder: Appears normal Extremities: Appears normal Sex: Female Maternal Findings: Cervix:  3.6 cm TA IMPRESSION: Assigned GA currently 25 weeks 6 days.  Appropriate fetal growth. Unremarkable anatomic survey.  No fetal anomalies identified. Electronically Signed   By: Danae Orleans M.D.   On: 11/01/2019 14:42   US RENAL  Result Date: 11/01/2019 CLINICAL DATA:  Flank pain.  Early third trimester gestation EXAM: RENAL / URINARY TRACT ULTRASOUND COMPLETE COMPARISON:  None. FINDINGS: Right Kidney: Renal measurements: 11.6 x 5.4 x 5.3 cm = volume: 174.1 mL . Echogenicity and renal cortical thickness are within normal limits. No mass or perinephric fluid visualized. There is moderate hydronephrosis and proximal ureterectasis on the right. There is a nonobstructing calculus in the mid to lower right kidney measuring 7 mm. No ureteral calculus seen in visualized portions of right ureter. Left Kidney: Renal measurements: 12.0 x 4.7 x 5.0 cm = volume: 145.7 mL. Echogenicity and renal cortical thickness are within normal limits. No mass or perinephric fluid visualized. There is mild hydronephrosis on the left without ureterectasis. No sonographically demonstrable calculus. Bladder: Appears normal  for degree of bladder distention. Flow from each distal ureter cannot be delineated on this study. Other: None. IMPRESSION: 1. Moderate hydronephrosis on the right. Slight hydronephrosis on the left. Proximal ureterectasis noted on the right. No focal obstructing ureteral lesions evident. Fullness of the collecting systems in this circumstance could be due to impression from the intrauterine gestation. Obstructing calculus, particular on the right, cannot be excluded on this study. 2.  Nonobstructing 7 mm calculus mid to lower right kidney. 3.  Study otherwise unremarkable. These results will be called to the ordering clinician or representative by the Radiologist Assistant, and communication documented in the PACS or [employer]. Electronically Signed   By: Bretta Bang III M.D.   On: 11/01/2019 12:22   US Abdomen Limited RUQ  Result Date: 11/01/2019 CLINICAL DATA:  Jaundice. EXAM: ULTRASOUND ABDOMEN LIMITED RIGHT UPPER QUADRANT COMPARISON:  None. FINDINGS: Gallbladder: No gallstones or wall thickening visualized. No sonographic Murphy sign noted by sonographer. Common bile duct: Diameter: 3 mm, within normal limits. Liver: No focal lesion identified. Within normal limits in parenchymal echogenicity. Portal vein is patent on color Doppler imaging with normal direction of blood flow towards the liver. Other: None. IMPRESSION: Negative.  No hepatobiliary abnormality identified. Electronically Signed   By: Danae Orleans M.D.   On: 11/01/2019 14:38    Constitutional: NAD, AAOx3  HE/ENT: extraocular movements grossly intact, moist mucous membranes CV: RRR Back: mild right lower back tenderness to palpation, symmetric PULM: normal respiratory effort, CTABL   Abd: gravid, mild suprapubic tenderness, non-distended, soft  Ext: Non-tender, Nonedmeatous   Psych: mood appropriate, speech normal Pelvic deferred  Monitoring/NST:  Baseline: 140bpm Variability: moderate Accelerations: 10x10 present x  >2 Decelerations: absent Time: Toco: some irritability   A/P: 24 y.o. [redacted]w[redacted]d here for antenatal surveillance during pregnancy.  Principle diagnosis: right sided lower back  pain  Labor  Not present  Fetal Wellbeing  Reactive NST, reassuring for GA  Continue daily NST  Prior [facility] at [facility]  09/21/2019 GA [redacted]w[redacted]d BPD 48.52mm 75% HC 178.30mm 57% AC 159.31mm 78% FL 36.29mm 86% HL 33.76mm85% EFW 400g 94%  10/22/2019 GA [redacted]w[redacted]d BPD58.49mm24% HC 223.64mm 28% AC 202.76mm 55% FL 44.9mm44% HL 40.5mm45% EFW 723g 52%  Today, AFI subjectively normal, mean GA by measurements=[redacted]w[redacted]d, which is consistent with assigned dating  Routine prenatal care  Daily prenatal vitamin  Patient has one documented prenatal visit at [redacted]w[redacted]d. List of labs in note, but no results visible in CareEverywhere. Varicella screen, rubella screen, RPR, HIV, and T&S ordered.   Urinary tract infection  UA with large leukocytes, >50 WBCs, many bacteria, WBC clumps, and few squamous epithelial cells  Urine culture added  Treat with ceftriaxone 1g q24h IV while inpatient, can convert to PO based on susceptibilities at discharge  Non-obstructing kidney stone  Per renal ultrasound, non-obstructing 58mm stone in mid to lower right kidney  Also noted moderate hydronephrosis of right kidney and slight hydronephrosis of left. Likely physiologic due to pregnancy.   Ordered IV hydration with 1L LR bolus, then NS at 169mL/hr. Also ordered tamsulosin 0.4mg  daily x 14 days for stone and Flexeril 5mg  q8h PRN pain.   Will consult urology for their input at hospitalist Dr. request.   Discussed with Dr. Evorn Gong via secure chat. He recommends the following:  If stone is non-obstructing, no changes.  If concern for obstructing stone (pain continues or worsens) or she develops fever or signs of sepsis, order low dose CT stone protocol to rule out obstructing stone before considering other intervention.   Jaundice,  hyperbilirubinemia   Urine with bilirubin; total, direct, and indirect bilirubin all elevated  AST and ALT elevated  Alkaline phosphatase elevated, but this can be physiologic related to pregnancy  Amylase, lipase, platelets, and PT/INR WNL  Hepatitis panel ordered, pending result.   RUQ ultrasound with no hepatobiliary abnormality identified  Will consult GI for their input at hospitalist Dr. Alvester Morin request  Anemic  Hemoglobin 10.0  Daily iron supplement ordered  History of substance abuse  Patient reports history of IVDU with last reported use of heroin and fentanyl last week.   She reports that she is seen at the [facility] for Methadone and is currently taking 32.5mg  daily. This was ordered via pharmacy, who states that they will call the treatment center in the morning to verify dosing.   Of note, urine drug screen was positive for methadone, as expected, and amphetamines, to which patient has not reported any use. Opiate screen negative.    Discussed patient with Dr. [facility], who advised consult to hospitalist team. Hospitalist Dr. Feliberto Gottron consulted for their input.    Clyde Lundborg 11/01/2019 2:59 PM  ----- 01/01/2020, CNM Certified Nurse Midwife [facility], Department of OB/GYN [facility]

## 2019-11-01 NOTE — Consult Note (Addendum)
Jonathon Bellows , MD [address] [place], [place], [place], [address] [address], [place], [place], [place], [address] Phone: [phone]  Fax: [phone]  Consultation  Referring Provider:   Lisette Grinder, CNM  Primary Care Physician:  Patient, No Pcp Per Primary Gastroenterologist:  None          Reason for Consultation:     Abnormal LFT's  Date of Admission:  11/01/2019 Date of Consultation:  11/01/2019         HPI:   Glenda Schwartz is a 24 y.o. female who is almost [redacted] weeks pregnant . She presented to the ER with right sided lower back pain. Found to have an UTI and commenced on Ceftriaxone.   I have been consulted for abnormal LFT's   I cannot find any old LFT's to compare with , On care everywhere I note last LFT's in 2018 which was normal.   On this admission   1. Urine drug screen positive for amphetamines and Methadone.  2. No protein or blood in urine.  3. Predominantly direct hyperbilirubinemia , Cr 0.47, T bil 6.8, AST 113, ALT 95 ,Alk phos 178 INR/Prothrombin Time 1.1. Hb 10.1 ,MCV 81. HIV negative.  4. Lipase normal.  5. RUQ USG- no obstruction - CBD 3 mm , no evidence of portal vein osbtruction.    She states that she got admitted to the hospital for abdominal pain which began in her back, right flank.  Still persists some nausea vomiting.  She says during her first trimester she did have significant nausea vomiting.  This is the first pregnancy that she is getting to term.  She miscarried her first pregnancy.  She denies any herbal supplements, excess Tylenol usage, alcohol consumption, over-the-counter medications.  She does however admit to using fentanyl and intravenous heroin at least a few times during the pregnancy.  She does admit that it is possible that the needles could have been contaminated although she is in the needle exchange program.  She denies any prior history of liver disease or family history of liver disease.  She says that a few days back  she has noticed that her urine was turning dark and she was turning yellow.  Denies any other complaints. Past Medical History:  Diagnosis Date  . Medical history non-contributory     Past Surgical History:  Procedure Laterality Date  . NO PAST SURGERIES      Prior to Admission medications   Medication Sig Start Date End Date Taking? Authorizing Provider  methadone (DOLOPHINE) 10 MG tablet Take 32.5 mg by mouth daily.   Yes [provider]    Family History  Problem Relation Age of Onset  . Anxiety disorder Mother   . Anxiety disorder Father      Social History   Tobacco Use  . Smoking status: Current Every Day Smoker    Packs/day: 0.50    Types: Cigarettes  . Smokeless tobacco: Never Used  Substance Use Topics  . Alcohol use: Not on file  . Drug use: Not on file    Allergies as of 11/01/2019  . (No Known Allergies)    Review of Systems:    All systems reviewed and negative except where noted in HPI.   Physical Exam:  Vital signs in last 24 hours: Temp:  [98 F (36.7 C)] 98 F (36.7 C) (05/09 1107) Pulse Rate:  [86] 86 (05/09 1107) Resp:  [18] 18 (05/09 1107) BP: (108)/(74) 108/74 (05/09 1107) Weight:  [  59 kg] 59 kg (05/09 1119)   General:   Pleasant, cooperative in NAD Head:  Normocephalic and atraumatic. Eyes:   No icterus.   Conjunctiva pink. PERRLA. Ears:  Normal auditory acuity. Neck:  Supple; no masses or thyroidomegaly Lungs: Respirations even and unlabored. Lungs clear to auscultation bilaterally.   No wheezes, crackles, or rhonchi.  Heart:  Regular rate and rhythm;  Without murmur, clicks, rubs or gallops Abdomen:  Soft, distended lower abdomen due to pregnancy, nontender. Normal bowel sounds. No appreciable masses or hepatomegaly.  No rebound or guarding.  Neurologic:  Alert and oriented x3;  grossly normal neurologically. Cervical Nodes:  No significant cervical adenopathy. Psych:  Alert and cooperative. Normal affect.  LAB  RESULTS: Recent Labs    11/01/19 1144  WBC 13.6*  HGB 10.0*  HCT 29.4*  PLT 292   BMET Recent Labs    11/01/19 1144  NA 137  K 3.4*  CL 104  CO2 23  GLUCOSE 93  BUN <5*  CREATININE 0.47  CALCIUM 8.3*   LFT Recent Labs    11/01/19 1143 11/01/19 1143 11/01/19 1144  PROT  --   --  6.1*  ALBUMIN  --   --  2.4*  AST  --   --  113*  ALT  --   --  95*  ALKPHOS  --   --  178*  BILITOT 7.0*   < > 6.8*  BILIDIR 4.3*  --   --   IBILI 2.7*  --   --    < > = values in this interval not displayed.   PT/INR Recent Labs    11/01/19 1144  LABPROT 13.9  INR 1.1    STUDIES: US OB Comp + 14 Wk  Result Date: 11/01/2019 CLINICAL DATA:  Substance abuse complicating 2nd trimester pregnancy. Jaundice. EXAM: OBSTETRICAL ULTRASOUND >14 WKS FINDINGS: Number of Fetuses: 1 Heart Rate:  133 bpm Movement: Yes Presentation: Breech Previa: No Placental Location: Anterior Amniotic Fluid (Subjective): Within normal limits Amniotic Fluid (Objective): Vertical pocket = 7.0cm FETAL BIOMETRY BPD: 6.5cm 26w 1d HC:   24.5cm 26w 4d AC:   21.9cm 26w 3d FL:   4.8cm 25w 6d Current Mean GA: 26w 0d [place] EDC: 02/07/2020 Assigned GA:  25w 6d Assigned EDC: 02/08/2020 FETAL ANATOMY Lateral Ventricles: Appears normal Thalami/CSP: Appears normal Posterior Fossa:  Appears normal Nuchal Region: Appears normal   NFT= N/A > 20 WKS Upper Lip: Appears normal Spine: Appears normal 4 Chamber Heart on Left: Appears normal LVOT: Appears normal RVOT: Appears normal Stomach on Left: Appears normal 3 Vessel Cord: Appears normal Cord Insertion site: Appears normal Kidneys: Appears normal Bladder: Appears normal Extremities: Appears normal Sex: Female Maternal Findings: Cervix:  3.6 cm TA IMPRESSION: Assigned GA currently 25 weeks 6 days.  Appropriate fetal growth. Unremarkable anatomic survey.  No fetal anomalies identified. Electronically Signed   By: Marlaine Hind M.D.   On: 11/01/2019 14:42   US RENAL  Result Date: 11/01/2019 CLINICAL  DATA:  Flank pain.  Early third trimester gestation EXAM: RENAL / URINARY TRACT ULTRASOUND COMPLETE COMPARISON:  None. FINDINGS: Right Kidney: Renal measurements: 11.6 x 5.4 x 5.3 cm = volume: 174.1 mL . Echogenicity and renal cortical thickness are within normal limits. No mass or perinephric fluid visualized. There is moderate hydronephrosis and proximal ureterectasis on the right. There is a nonobstructing calculus in the mid to lower right kidney measuring 7 mm. No ureteral calculus seen in visualized portions of right ureter. Left Kidney:  Renal measurements: 12.0 x 4.7 x 5.0 cm = volume: 145.7 mL. Echogenicity and renal cortical thickness are within normal limits. No mass or perinephric fluid visualized. There is mild hydronephrosis on the left without ureterectasis. No sonographically demonstrable calculus. Bladder: Appears normal for degree of bladder distention. Flow from each distal ureter cannot be delineated on this study. Other: None. IMPRESSION: 1. Moderate hydronephrosis on the right. Slight hydronephrosis on the left. Proximal ureterectasis noted on the right. No focal obstructing ureteral lesions evident. Fullness of the collecting systems in this circumstance could be due to impression from the intrauterine gestation. Obstructing calculus, particular on the right, cannot be excluded on this study. 2.  Nonobstructing 7 mm calculus mid to lower right kidney. 3.  Study otherwise unremarkable. These results will be called to the ordering clinician or representative by the Radiologist Assistant, and communication documented in the PACS or [employer]. Electronically Signed   By: Lowella Grip III M.D.   On: 11/01/2019 12:22   US Abdomen Limited RUQ  Result Date: 11/01/2019 CLINICAL DATA:  Jaundice. EXAM: ULTRASOUND ABDOMEN LIMITED RIGHT UPPER QUADRANT COMPARISON:  None. FINDINGS: Gallbladder: No gallstones or wall thickening visualized. No sonographic Murphy sign noted by sonographer. Common  bile duct: Diameter: 3 mm, within normal limits. Liver: No focal lesion identified. Within normal limits in parenchymal echogenicity. Portal vein is patent on color Doppler imaging with normal direction of blood flow towards the liver. Other: None. IMPRESSION: Negative.  No hepatobiliary abnormality identified. Electronically Signed   By: Marlaine Hind M.D.   On: 11/01/2019 14:38      Impression / Plan:   VENIDA TSUKAMOTO is a 24 y.o. y/o female with abnormal LFT's in her second trimester 27 weeks  - 1 week short of third trimester . Pattern of abnormal LFT's is not clear cut of   a cholestatic picture rather vs a  hepatocellular pattern . Alk phos is not significantly elevated and usually it can be slightly elevated in pregnancy due to placental production of alkaline phosphatase.  Differentials to consider Acute fatty liver of pregnancy vs DILI vs acute viral hepatitis. BP not elevated and no evidence of proteinuria making Preeclampsia less likely, Per [place] criteria has 3 + , further tests awaited.  Most likely etiology I suspect is an acute viral hepatitis due to possibly contaminated needles used to inject heroin   Plan  1, Check tylenol levels, Check Hep B/C/HIV,EBV,HSV,VZV,CMV viral loads,Hep E IGM, serum urate ,TSH for any acute hepatitis. Will also order complete autoimmune panel, iron studies, haptoglobulin to r/o hemolysis - I will order these  2. Check LFT's, INR q 12 hourly( I havnt ordered them please order them) - if change in mental status then page GI on call immediately as it may be a sign of hepatic encephelopathy and liver failure warranting transfer to tertiary care center. Monitor glucose on a regular basis- hypoglycemia is an alarm sign .   3. Agree with blood cultures, treating UTI  4. Suggest MRCP if ok from OBGYN point of view -Avoid gandolinium - Please place order if ok   5. Monitor platelet count closely - if starts to drop consider HEELP syndrome in differential   Dr.  Marius Ditch will be following the patient from tomorrow.  Discussed the plan with Lisette Grinder, CNM  Thank you for involving me in the care of this patient.      LOS: 0 days   Jonathon Bellows, MD  11/01/2019, 3:57 PM

## 2019-11-02 ENCOUNTER — Observation Stay: Payer: Medicaid Other

## 2019-11-02 DIAGNOSIS — R17 Unspecified jaundice: Secondary | ICD-10-CM | POA: Diagnosis not present

## 2019-11-02 DIAGNOSIS — R945 Abnormal results of liver function studies: Secondary | ICD-10-CM | POA: Diagnosis not present

## 2019-11-02 DIAGNOSIS — D649 Anemia, unspecified: Secondary | ICD-10-CM

## 2019-11-02 DIAGNOSIS — E876 Hypokalemia: Secondary | ICD-10-CM

## 2019-11-02 DIAGNOSIS — N1330 Unspecified hydronephrosis: Secondary | ICD-10-CM | POA: Diagnosis not present

## 2019-11-02 DIAGNOSIS — N3000 Acute cystitis without hematuria: Secondary | ICD-10-CM

## 2019-11-02 DIAGNOSIS — R109 Unspecified abdominal pain: Secondary | ICD-10-CM

## 2019-11-02 DIAGNOSIS — Z3A25 25 weeks gestation of pregnancy: Secondary | ICD-10-CM

## 2019-11-02 LAB — CBC
HCT: 29.7 % — ABNORMAL LOW (ref 36.0–46.0)
Hemoglobin: 10 g/dL — ABNORMAL LOW (ref 12.0–15.0)
MCH: 28 pg (ref 26.0–34.0)
MCHC: 33.7 g/dL (ref 30.0–36.0)
MCV: 83.2 fL (ref 80.0–100.0)
Platelets: 307 10*3/uL (ref 150–400)
RBC: 3.57 MIL/uL — ABNORMAL LOW (ref 3.87–5.11)
RDW: 16 % — ABNORMAL HIGH (ref 11.5–15.5)
WBC: 9.4 10*3/uL (ref 4.0–10.5)
nRBC: 0 % (ref 0.0–0.2)

## 2019-11-02 LAB — COMPREHENSIVE METABOLIC PANEL
ALT: 82 U/L — ABNORMAL HIGH (ref 0–44)
ALT: 87 U/L — ABNORMAL HIGH (ref 0–44)
AST: 118 U/L — ABNORMAL HIGH (ref 15–41)
AST: 94 U/L — ABNORMAL HIGH (ref 15–41)
Albumin: 2.3 g/dL — ABNORMAL LOW (ref 3.5–5.0)
Albumin: 2.3 g/dL — ABNORMAL LOW (ref 3.5–5.0)
Alkaline Phosphatase: 159 U/L — ABNORMAL HIGH (ref 38–126)
Alkaline Phosphatase: 174 U/L — ABNORMAL HIGH (ref 38–126)
Anion gap: 6 (ref 5–15)
Anion gap: 7 (ref 5–15)
BUN: <5 mg/dL — ABNORMAL LOW (ref 6–20)
BUN: <5 mg/dL — ABNORMAL LOW (ref 6–20)
CO2: 23 mmol/L (ref 22–32)
CO2: 25 mmol/L (ref 22–32)
Calcium: 7.9 mg/dL — ABNORMAL LOW (ref 8.9–10.3)
Calcium: 8 mg/dL — ABNORMAL LOW (ref 8.9–10.3)
Chloride: 106 mmol/L (ref 98–111)
Chloride: 109 mmol/L (ref 98–111)
Creatinine, Ser: 0.46 mg/dL (ref 0.44–1.00)
Creatinine, Ser: 0.52 mg/dL (ref 0.44–1.00)
GFR calc Af Amer: >60 mL/min (ref >60)
GFR calc Af Amer: >60 mL/min (ref >60)
GFR calc non Af Amer: >60 mL/min (ref >60)
GFR calc non Af Amer: >60 mL/min (ref >60)
Glucose, Bld: 104 mg/dL — ABNORMAL HIGH (ref 70–99)
Glucose, Bld: 93 mg/dL (ref 70–99)
Potassium: 3 mmol/L — ABNORMAL LOW (ref 3.5–5.1)
Potassium: 3.4 mmol/L — ABNORMAL LOW (ref 3.5–5.1)
Sodium: 138 mmol/L (ref 135–145)
Sodium: 138 mmol/L (ref 135–145)
Total Bilirubin: 5.3 mg/dL — ABNORMAL HIGH (ref 0.3–1.2)
Total Bilirubin: 5.7 mg/dL — ABNORMAL HIGH (ref 0.3–1.2)
Total Protein: 6 g/dL — ABNORMAL LOW (ref 6.5–8.1)
Total Protein: 6 g/dL — ABNORMAL LOW (ref 6.5–8.1)

## 2019-11-02 LAB — TSH: TSH: 0.352 u[IU]/mL (ref 0.350–4.500)

## 2019-11-02 LAB — IRON AND TIBC
Iron: 33 ug/dL (ref 28–170)
Saturation Ratios: 8 % — ABNORMAL LOW (ref 10.4–31.8)
TIBC: 416 ug/dL (ref 250–450)
UIBC: 383 ug/dL

## 2019-11-02 LAB — BASIC METABOLIC PANEL
Anion gap: 5 (ref 5–15)
BUN: <5 mg/dL — ABNORMAL LOW (ref 6–20)
CO2: 24 mmol/L (ref 22–32)
Calcium: 8 mg/dL — ABNORMAL LOW (ref 8.9–10.3)
Chloride: 109 mmol/L (ref 98–111)
Creatinine, Ser: 0.5 mg/dL (ref 0.44–1.00)
GFR calc Af Amer: >60 mL/min (ref >60)
GFR calc non Af Amer: >60 mL/min (ref >60)
Glucose, Bld: 95 mg/dL (ref 70–99)
Potassium: 3.1 mmol/L — ABNORMAL LOW (ref 3.5–5.1)
Sodium: 138 mmol/L (ref 135–145)

## 2019-11-02 LAB — FERRITIN: Ferritin: 41 ng/mL (ref 11–307)

## 2019-11-02 LAB — GAMMA GT: GGT: 92 U/L — ABNORMAL HIGH (ref 7–50)

## 2019-11-02 LAB — PROTIME-INR
INR: 1.1 (ref 0.8–1.2)
INR: 1.1 (ref 0.8–1.2)
Prothrombin Time: 13.7 seconds (ref 11.4–15.2)
Prothrombin Time: 14.1 seconds (ref 11.4–15.2)

## 2019-11-02 LAB — TYPE AND SCREEN
ABO/RH(D): O POS
Antibody Screen: NEGATIVE

## 2019-11-02 LAB — CK: Total CK: 28 U/L — ABNORMAL LOW (ref 38–234)

## 2019-11-02 LAB — RPR: RPR Ser Ql: NONREACTIVE

## 2019-11-02 LAB — URIC ACID: Uric Acid, Serum: 4.6 mg/dL (ref 2.5–7.1)

## 2019-11-02 LAB — ACETAMINOPHEN LEVEL: Acetaminophen (Tylenol), Serum: <10 ug/mL — ABNORMAL LOW (ref 10–30)

## 2019-11-02 MED ORDER — GABAPENTIN 600 MG PO TABS
300.0000 mg | ORAL_TABLET | Freq: Two times a day (BID) | ORAL | Status: DC
  Administered 2019-11-02 – 2019-11-03 (×3): 300 mg via ORAL
  Filled 2019-11-02 (×4): qty 0.5

## 2019-11-02 MED ORDER — LORAZEPAM 2 MG/ML IJ SOLN: 2.0000 mg | INTRAMUSCULAR | Status: AC

## 2019-11-02 MED ORDER — MORPHINE SULFATE (PF) 2 MG/ML IV SOLN
1.0000 mg | INTRAVENOUS | Status: DC | PRN
  Administered 2019-11-02 – 2019-11-03 (×6): 1 mg via INTRAVENOUS
  Filled 2019-11-02 (×6): qty 1

## 2019-11-02 MED ORDER — POTASSIUM CHLORIDE CRYS ER 20 MEQ PO TBCR
20.0000 meq | EXTENDED_RELEASE_TABLET | Freq: Once | ORAL | Status: AC
  Administered 2019-11-02: 20 meq via ORAL
  Filled 2019-11-02: qty 1

## 2019-11-02 MED ORDER — OXYCODONE HCL 5 MG PO TABS: 5.0000 mg | ORAL_TABLET | Freq: Four times a day (QID) | ORAL | Status: DC | PRN

## 2019-11-02 MED ORDER — BUSPIRONE HCL 10 MG PO TABS
10.0000 mg | ORAL_TABLET | Freq: Two times a day (BID) | ORAL | Status: DC
  Administered 2019-11-02 – 2019-11-03 (×2): 10 mg via ORAL
  Filled 2019-11-02 (×4): qty 1

## 2019-11-02 MED ORDER — MIDAZOLAM HCL (PF) 2 MG/2ML IJ SOLN
1.0000 mg | INTRAMUSCULAR | Status: DC | PRN
  Filled 2019-11-02 (×2): qty 1

## 2019-11-02 MED ORDER — LORAZEPAM 2 MG/ML IJ SOLN
2.0000 mg | Freq: Once | INTRAMUSCULAR | Status: AC
  Administered 2019-11-02: 2 mg via INTRAVENOUS
  Filled 2019-11-02: qty 1

## 2019-11-02 NOTE — Progress Notes (Signed)
ANTEPARTUM PROGRESS NOTE  Glenda Schwartz is a 24 y.o. G3P0020 at [redacted]w[redacted]d with [facility] of Estimated Date of Delivery: 02/08/20 who is admitted for UTI, elevated LFTS/bilirubin with jaundice.   Length of Stay:  0 Days. Admitted 11/01/2019  Subjective: Patient reports good fetal movement.  She reports no uterine contractions, no bleeding and no loss of fluid per vagina.  Vitals:  BP 115/81 (BP Location: Right Arm)   Pulse 72   Temp 98 F (36.7 C)   Resp 18   Ht  (1.575 m)   Wt 59 kg   SpO2 99%   BMI 23.78 kg/m   Physical Examination: CONSTITUTIONAL: Well-developed, well-nourished female, jaundiced skin and eyes noted.  HENT:  Normocephalic, atraumatic NECK: Normal range of motion NEUROLGIC: Alert and oriented to person, place, and time.  PSYCHIATRIC: Very anxious, fidgety.  CARDIOVASCULAR: Normal heart rate noted, regular rhythm RESPIRATORY: Effort and breath sounds normal MUSCULOSKELETAL: Normal range of motion. No edema and no tenderness. 2+ distal pulses. ABDOMEN: Soft, nontender, nondistended, gravid.  Fetal monitoring: FHR: NST scheduled today. Pt denies uterine activity.    Results for orders placed or performed during the hospital encounter of 11/01/19 (from the past 48 hour(s))  Urine Drug Screen, Qualitative ([facility] only)     Status: Abnormal   Collection Time: 11/01/19 11:43 AM  Result Value Ref Range   Tricyclic, Ur Screen NONE DETECTED NONE DETECTED   Amphetamines, Ur Screen POSITIVE (A) NONE DETECTED   MDMA (Ecstasy)Ur Screen NONE DETECTED NONE DETECTED   Cocaine Metabolite,Ur [place] NONE DETECTED NONE DETECTED   Opiate, Ur Screen NONE DETECTED NONE DETECTED   Phencyclidine (PCP) Ur S NONE DETECTED NONE DETECTED   Cannabinoid 50 Ng, Ur [place] NONE DETECTED NONE DETECTED   Barbiturates, Ur Screen NONE DETECTED NONE DETECTED   Benzodiazepine, Ur Scrn NONE DETECTED NONE DETECTED   Methadone Scn, Ur POSITIVE (A) NONE DETECTED    Comment: (NOTE) Tricyclics + metabolites, urine     Cutoff 1000 ng/mL Amphetamines + metabolites, urine  Cutoff 1000 ng/mL MDMA (Ecstasy), urine              Cutoff 500 ng/mL Cocaine Metabolite, urine          Cutoff 300 ng/mL Opiate + metabolites, urine        Cutoff 300 ng/mL Phencyclidine (PCP), urine         Cutoff 25 ng/mL Cannabinoid, urine                 Cutoff 50 ng/mL Barbiturates + metabolites, urine  Cutoff 200 ng/mL Benzodiazepine, urine              Cutoff 200 ng/mL Methadone, urine                   Cutoff 300 ng/mL The urine drug screen provides only a preliminary, unconfirmed analytical test result and should not be used for non-medical purposes. Clinical consideration and professional judgment should be applied to any positive drug screen result due to possible interfering substances. A more specific alternate chemical method must be used in order to obtain a confirmed analytical result. Gas chromatography / mass spectrometry (GC/MS) is the preferred confirmat ory method. Performed at [facility], [address] [place], [place] [address]   Urinalysis, Complete w Microscopic     Status: Abnormal   Collection Time: 11/01/19 11:43 AM  Result Value Ref Range   Color, Urine AMBER (A) YELLOW    Comment: BIOCHEMICALS MAY BE  AFFECTED BY COLOR   APPearance CLOUDY (A) CLEAR   Specific Gravity, Urine 1.009 1.005 - 1.030   pH 6.0 5.0 - 8.0   Glucose, UA NEGATIVE NEGATIVE mg/dL   Hgb urine dipstick NEGATIVE NEGATIVE   Bilirubin Urine MODERATE (A) NEGATIVE   Ketones, ur NEGATIVE NEGATIVE mg/dL   Protein, ur NEGATIVE NEGATIVE mg/dL   Nitrite NEGATIVE NEGATIVE   Leukocytes,Ua LARGE (A) NEGATIVE   RBC / HPF 6-10 0 - 5 RBC/hpf   WBC, UA >50 (H) 0 - 5 WBC/hpf   Bacteria, UA MANY (A) NONE SEEN   Squamous Epithelial / LPF 0-5 0 - 5   WBC Clumps PRESENT    Mucus PRESENT     Comment: Performed at [facility], [address] [place], [place] [address]  Bilirubin, fractionated(tot/dir/indir)     Status:  Abnormal   Collection Time: 11/01/19 11:43 AM  Result Value Ref Range   Total Bilirubin 7.0 (H) 0.3 - 1.2 mg/dL   Bilirubin, Direct 4.3 (H) 0.0 - 0.2 mg/dL   Indirect Bilirubin 2.7 (H) 0.3 - 0.9 mg/dL    Comment: Performed at [facility], [address] [place], [place] [address]  Amylase     Status: None   Collection Time: 11/01/19 11:43 AM  Result Value Ref Range   Amylase 41 28 - 100 U/L    Comment: Performed at [facility], [address] [place], [place] [address]  Lipase, blood     Status: None   Collection Time: 11/01/19 11:43 AM  Result Value Ref Range   Lipase 19 11 - 51 U/L    Comment: Performed at [facility], [address] [place], [place] [address]  Comprehensive metabolic panel     Status: Abnormal   Collection Time: 11/01/19 11:44 AM  Result Value Ref Range   Sodium 137 135 - 145 mmol/L   Potassium 3.4 (L) 3.5 - 5.1 mmol/L   Chloride 104 98 - 111 mmol/L   CO2 23 22 - 32 mmol/L   Glucose, Bld 93 70 - 99 mg/dL    Comment: Glucose reference range applies only to samples taken after fasting for at least 8 hours.   BUN <5 (L) 6 - 20 mg/dL   Creatinine, Ser 4.130.47 0.44 - 1.00 mg/dL   Calcium 8.3 (L) 8.9 - 10.3 mg/dL   Total Protein 6.1 (L) 6.5 - 8.1 g/dL   Albumin 2.4 (L) 3.5 - 5.0 g/dL   AST 244113 (H) 15 - 41 U/L   ALT 95 (H) 0 - 44 U/L   Alkaline Phosphatase 178 (H) 38 - 126 U/L   Total Bilirubin 6.8 (H) 0.3 - 1.2 mg/dL   GFR calc non Af Amer >60 >60 mL/min   GFR calc Af Amer >60 >60 mL/min   Anion gap 10 5 - 15    Comment: Performed at [facility], [address] [place], [place] [address]  Protime-INR     Status: None   Collection Time: 11/01/19 11:44 AM  Result Value Ref Range   Prothrombin Time 13.9 11.4 - 15.2 seconds   INR 1.1 0.8 - 1.2    Comment: (NOTE) INR goal varies based on device and disease states. Performed at [facility], [address] [place], [place] [address]   CBC     Status:  Abnormal   Collection Time: 11/01/19 11:44 AM  Result Value Ref Range   WBC 13.6 (H) 4.0 - 10.5 K/uL   RBC 3.59 (  L) 3.87 - 5.11 MIL/uL   Hemoglobin 10.0 (L) 12.0 - 15.0 g/dL   HCT 09.8 (L) 11.9 - 14.7 %   MCV 81.9 80.0 - 100.0 fL   MCH 27.9 26.0 - 34.0 pg   MCHC 34.0 30.0 - 36.0 g/dL   RDW 82.9 56.2 - 13.0 %   Platelets 292 150 - 400 K/uL   nRBC 0.0 0.0 - 0.2 %    Comment: Performed at [facility], [address] [place], [place] [address]  RPR     Status: None   Collection Time: 11/01/19  1:31 PM  Result Value Ref Range   RPR Ser Ql NON REACTIVE NON REACTIVE    Comment: Performed at [facility], [address], [place], [place] [address]  Rapid HIV screen [facility] L&D dept ONLY)     Status: None   Collection Time: 11/01/19  1:31 PM  Result Value Ref Range   HIV-1 P24 Antigen - HIV24 NON REACTIVE NON REACTIVE    Comment: (NOTE) Detection of p24 may be inhibited by biotin in the sample, causing false negative results in acute infection.    HIV 1/2 Antibodies NON REACTIVE NON REACTIVE   Interpretation (HIV Ag Ab)      A non reactive test result means that HIV 1 or HIV 2 antibodies and HIV 1 p24 antigen were not detected in the specimen.    Comment: Performed at [facility], [address] [place], [place] [address]  CULTURE, BLOOD (ROUTINE X 2) w Reflex to ID Panel     Status: None (Preliminary result)   Collection Time: 11/01/19  2:45 PM   Specimen: BLOOD  Result Value Ref Range   Specimen Description BLOOD RIGHT ANTECUBITAL    Special Requests      BOTTLES DRAWN AEROBIC AND ANAEROBIC Blood Culture adequate volume   Culture      NO GROWTH < 24 HOURS Performed at [facility], [address] [place], [place] [address]    Report Status PENDING   CULTURE, BLOOD (ROUTINE X 2) w Reflex to ID Panel     Status: None (Preliminary result)   Collection Time: 11/01/19  2:53 PM   Specimen: BLOOD  Result Value Ref Range   Specimen Description  BLOOD LEFT ANTECUBITAL    Special Requests      BOTTLES DRAWN AEROBIC AND ANAEROBIC Blood Culture adequate volume   Culture      NO GROWTH < 24 HOURS Performed at [facility], [address] [place], [place] [address]    Report Status PENDING   Respiratory Panel by RT PCR (Flu A&B, Covid) - Nasopharyngeal Swab     Status: None   Collection Time: 11/01/19  2:56 PM   Specimen: Nasopharyngeal Swab  Result Value Ref Range   SARS Coronavirus 2 by RT PCR NEGATIVE NEGATIVE    Comment: (NOTE) SARS-CoV-2 target nucleic acids are NOT DETECTED. The SARS-CoV-2 RNA is generally detectable in upper respiratoy specimens during the acute phase of infection. The lowest concentration of SARS-CoV-2 viral copies this assay can detect is 131 copies/mL. A negative result does not preclude SARS-Cov-2 infection and should not be used as the sole basis for treatment or other patient management decisions. A negative result may occur with  improper specimen collection/handling, submission of specimen other than nasopharyngeal swab, presence of viral mutation(s) within the areas targeted by this assay, and inadequate number of viral copies (<131 copies/mL). A negative result must be combined with  clinical observations, patient history, and epidemiological information. The expected result is Negative. Fact Sheet for Patients:  https://www.moore.com/ Fact Sheet for Healthcare Providers:  https://www.young.biz/ This test is not yet ap proved or cleared by the [place] FDA and  has been authorized for detection and/or diagnosis of SARS-CoV-2 by FDA under an Emergency Use Authorization (EUA). This EUA will remain  in effect (meaning this test can be used) for the duration of the COVID-19 declaration under Section 564(b)(1) of the Act, 21 U.S.C. section 360bbb-3(b)(1), unless the authorization is terminated or revoked sooner.    Influenza A by PCR NEGATIVE  NEGATIVE   Influenza B by PCR NEGATIVE NEGATIVE    Comment: (NOTE) The Xpert Xpress SARS-CoV-2/FLU/RSV assay is intended as an aid in  the diagnosis of influenza from Nasopharyngeal swab specimens and  should not be used as a sole basis for treatment. Nasal washings and  aspirates are unacceptable for Xpert Xpress SARS-CoV-2/FLU/RSV  testing. Fact Sheet for Patients: https://www.moore.com/ Fact Sheet for Healthcare Providers: https://www.young.biz/ This test is not yet approved or cleared by the [place] FDA and  has been authorized for detection and/or diagnosis of SARS-CoV-2 by  FDA under an Emergency Use Authorization (EUA). This EUA will remain  in effect (meaning this test can be used) for the duration of the  Covid-19 declaration under Section 564(b)(1) of the Act, 21  U.S.C. section 360bbb-3(b)(1), unless the authorization is  terminated or revoked. Performed at [facility], [address] [place], [place] [address]   Type and screen [facility]     Status: None   Collection Time: 11/02/19 12:09 AM  Result Value Ref Range   ABO/RH(D) O POS    Antibody Screen NEG    Sample Expiration      11/05/2019,2359 Performed at [facility], [address] [place], [place] [address]   Iron and TIBC     Status: Abnormal   Collection Time: 11/02/19 12:09 AM  Result Value Ref Range   Iron 33 28 - 170 ug/dL   TIBC 720 947 - 096 ug/dL   Saturation Ratios 8 (L) 10.4 - 31.8 %   UIBC 383 ug/dL    Comment: Performed at [facility], [address] [place], [place] [address]  Ferritin     Status: None   Collection Time: 11/02/19 12:09 AM  Result Value Ref Range   Ferritin 41 11 - 307 ng/mL    Comment: Performed at [facility], [address] [place], [place] [address]  Gamma GT     Status: Abnormal   Collection Time: 11/02/19 12:09 AM  Result Value Ref Range   GGT 92 (H) 7 -  50 U/L    Comment: Performed at [facility], [address], [place], [place] [address]  CK     Status: Abnormal   Collection Time: 11/02/19 12:09 AM  Result Value Ref Range   Total CK 28 (L) 38 - 234 U/L    Comment: Performed at [facility], [address] [place], [place] [address]  Acetaminophen level     Status: Abnormal   Collection Time: 11/02/19 12:09 AM  Result Value Ref Range   Acetaminophen (Tylenol), Serum <10 (L) 10 - 30 ug/mL    Comment: (NOTE) Therapeutic concentrations vary significantly. A range of 10-30 ug/mL  may be an effective concentration for many patients. However, some  are best treated at concentrations outside of this range. Acetaminophen concentrations >150 ug/mL  at 4 hours after ingestion  and >50 ug/mL at 12 hours after ingestion are often associated with  toxic reactions. Performed at [facility], [address] [place], [place] [address]   TSH     Status: None   Collection Time: 11/02/19 12:09 AM  Result Value Ref Range   TSH 0.352 0.350 - 4.500 uIU/mL    Comment: Performed by a 3rd Generation assay with a functional sensitivity of <=0.01 uIU/mL. Performed at [facility], [address] [place], [place] [address]   Uric acid     Status: None   Collection Time: 11/02/19 12:09 AM  Result Value Ref Range   Uric Acid, Serum 4.6 2.5 - 7.1 mg/dL    Comment: ICTERUS AT THIS LEVEL MAY AFFECT RESULT Performed at [facility], [address] [place], [place] [address]   Comprehensive metabolic panel     Status: Abnormal   Collection Time: 11/02/19 12:09 AM  Result Value Ref Range   Sodium 138 135 - 145 mmol/L   Potassium 3.0 (L) 3.5 - 5.1 mmol/L   Chloride 106 98 - 111 mmol/L   CO2 25 22 - 32 mmol/L   Glucose, Bld 104 (H) 70 - 99 mg/dL    Comment: Glucose reference range applies only to samples taken after fasting for at least 8 hours.   BUN <5 (L) 6 - 20 mg/dL   Creatinine, Ser 0.52 0.44 - 1.00  mg/dL   Calcium 8.0 (L) 8.9 - 10.3 mg/dL   Total Protein 6.0 (L) 6.5 - 8.1 g/dL   Albumin 2.3 (L) 3.5 - 5.0 g/dL   AST 94 (H) 15 - 41 U/L   ALT 82 (H) 0 - 44 U/L   Alkaline Phosphatase 159 (H) 38 - 126 U/L   Total Bilirubin 5.7 (H) 0.3 - 1.2 mg/dL   GFR calc non Af Amer >60 >60 mL/min   GFR calc Af Amer >60 >60 mL/min   Anion gap 7 5 - 15    Comment: Performed at [facility], [address] [place], [place] [address]  Protime-INR     Status: None   Collection Time: 11/02/19 12:09 AM  Result Value Ref Range   Prothrombin Time 14.1 11.4 - 15.2 seconds   INR 1.1 0.8 - 1.2    Comment: (NOTE) INR goal varies based on device and disease states. Performed at [facility], [address] [place], [place] [address]     [place] Comp + 14 Wk  Result Date: 11/01/2019 CLINICAL DATA:  Substance abuse complicating 2nd trimester pregnancy. Jaundice. EXAM: OBSTETRICAL ULTRASOUND >14 WKS FINDINGS: Number of Fetuses: 1 Heart Rate:  133 bpm Movement: Yes Presentation: Breech Previa: No Placental Location: Anterior Amniotic Fluid (Subjective): Within normal limits Amniotic Fluid (Objective): Vertical pocket = 7.0cm FETAL BIOMETRY BPD: 6.5cm 26w 1d HC:   24.5cm 26w 4d AC:   21.9cm 26w 3d FL:   4.8cm 25w 6d Current Mean GA: 26w 0d [place] EDC: 02/07/2020 Assigned GA:  25w 6d Assigned EDC: 02/08/2020 FETAL ANATOMY Lateral Ventricles: Appears normal Thalami/CSP: Appears normal Posterior Fossa:  Appears normal Nuchal Region: Appears normal   NFT= N/A > 20 WKS Upper Lip: Appears normal Spine: Appears normal 4 Chamber Heart on Left: Appears normal LVOT: Appears normal RVOT: Appears normal Stomach on Left: Appears normal 3 Vessel Cord: Appears normal Cord Insertion site: Appears normal Kidneys: Appears normal Bladder: Appears normal Extremities: Appears normal Sex: Female Maternal Findings: Cervix:  3.6 cm TA  IMPRESSION: Assigned GA currently 25 weeks 6 days.  Appropriate fetal growth. Unremarkable  anatomic survey.  No fetal anomalies identified. Electronically Signed   By: Danae Orleans M.D.   On: 11/01/2019 14:42   US RENAL  Result Date: 11/01/2019 CLINICAL DATA:  Flank pain.  Early third trimester gestation EXAM: RENAL / URINARY TRACT ULTRASOUND COMPLETE COMPARISON:  None. FINDINGS: Right Kidney: Renal measurements: 11.6 x 5.4 x 5.3 cm = volume: 174.1 mL . Echogenicity and renal cortical thickness are within normal limits. No mass or perinephric fluid visualized. There is moderate hydronephrosis and proximal ureterectasis on the right. There is a nonobstructing calculus in the mid to lower right kidney measuring 7 mm. No ureteral calculus seen in visualized portions of right ureter. Left Kidney: Renal measurements: 12.0 x 4.7 x 5.0 cm = volume: 145.7 mL. Echogenicity and renal cortical thickness are within normal limits. No mass or perinephric fluid visualized. There is mild hydronephrosis on the left without ureterectasis. No sonographically demonstrable calculus. Bladder: Appears normal for degree of bladder distention. Flow from each distal ureter cannot be delineated on this study. Other: None. IMPRESSION: 1. Moderate hydronephrosis on the right. Slight hydronephrosis on the left. Proximal ureterectasis noted on the right. No focal obstructing ureteral lesions evident. Fullness of the collecting systems in this circumstance could be due to impression from the intrauterine gestation. Obstructing calculus, particular on the right, cannot be excluded on this study. 2.  Nonobstructing 7 mm calculus mid to lower right kidney. 3.  Study otherwise unremarkable. These results will be called to the ordering clinician or representative by the Radiologist Assistant, and communication documented in the PACS or [employer]. Electronically Signed   By: Bretta Bang III M.D.   On: 11/01/2019 12:22   US Abdomen Limited RUQ  Result Date: 11/01/2019 CLINICAL DATA:  Jaundice. EXAM: ULTRASOUND ABDOMEN  LIMITED RIGHT UPPER QUADRANT COMPARISON:  None. FINDINGS: Gallbladder: No gallstones or wall thickening visualized. No sonographic Murphy sign noted by sonographer. Common bile duct: Diameter: 3 mm, within normal limits. Liver: No focal lesion identified. Within normal limits in parenchymal echogenicity. Portal vein is patent on color Doppler imaging with normal direction of blood flow towards the liver. Other: None. IMPRESSION: Negative.  No hepatobiliary abnormality identified. Electronically Signed   By: Danae Orleans M.D.   On: 11/01/2019 14:38    Current scheduled medications . busPIRone  10 mg Oral BID  . ferrous sulfate  325 mg Oral Q breakfast  . gabapentin  300 mg Oral BID  . methadone  32.5 mg Oral Daily  . nicotine  21 mg Transdermal Daily  . prenatal multivitamin  1 tablet Oral Q1200  . tamsulosin  0.4 mg Oral Daily    I have reviewed the patient's current medications.  ASSESSMENT: Patient Active Problem List   Diagnosis Date Noted  . Low back pain during pregnancy 11/01/2019  . UTI (urinary tract infection) 11/01/2019  . Abnormal LFTs 11/01/2019  . IVDU (intravenous drug user) 11/01/2019  . Hypokalemia 11/01/2019  . Right flank pain 11/01/2019  . Hydronephrosis 11/01/2019  . Normocytic anemia 11/01/2019  . Pregnancy 11/01/2019    PLAN:  - Continue routine antenatal care- daily NST ordered - Pt has had 1 visit with [facility] and no prenatal labs done at that time. Prenatal labs pending- obtained yesterday on admission.  - MFM anatomy and growth [place] reviewed, Growth US done 4/29 EFW 723g, 52%, anterior placenta, 3v Cord, normal amt fluid, MVP 6.4cm, cephalic  presentation, FHR 170bpm.  - pt reports planning to deliver here at [facility] due to closer proximity. Will need to review options for prenatal care for high risk pregnancy.  - CSW consulted.   - Acute withdrawal due to opioid use d/o- consulted with Dr Feliberto Gottron, attempted to reach Methadone clinic-  So [facility] [phone] to clarify methadone dose at home.  - Consulted with hospitalist regarding acute w/d sx, discussion ongoing with hospitalist and GI for best mgmt of sx balanced with acute liver issues. Hospitalist and GI to manage medical concerns.    Randa Ngo, CNM 11/02/2019  12:05 PM

## 2019-11-02 NOTE — Progress Notes (Signed)
Glenda Repress, MD [address]  [address]  [place], [place] [address]  Main: [phone]  Fax: [phone] Pager: [phone]   Subjective: Patient is going through opioid withdrawal symptoms.  She is restless, anxious.  She says she cannot undergo MRCP as she is claustrophobic and anxious.  She denies any abdominal pain.  No acute events overnight   Objective: Vital signs in last 24 hours: Vitals:   11/02/19 0000 11/02/19 0615 11/02/19 0737 11/02/19 1147  BP: 100/60 (!) 103/59 118/75 115/81  Pulse: 79 86 80 72  Resp: 18 18 20 18   Temp: 98.4 F (36.9 C) 98.5 F (36.9 C) 98.7 F (37.1 C) 98 F (36.7 C)  TempSrc: Oral Oral Oral   SpO2: 98% 99% 98% 99%  Weight:      Height:       Weight change:   Intake/Output Summary (Last 24 hours) at 11/02/2019 1256 Last data filed at 11/02/2019 1121 Gross per 24 hour  Intake 2416.67 ml  Output 2200 ml  Net 216.67 ml     Exam: Heart:: Regular rate and rhythm, S1S2 present or without murmur or extra heart sounds Lungs: normal and clear to auscultation Abdomen: soft, nontender, normal bowel sounds   Lab Results: CBC Latest Ref Rng & Units 11/01/2019  WBC 4.0 - 10.5 K/uL 13.6(H)  Hemoglobin 12.0 - 15.0 g/dL 10.0(L)  Hematocrit 36.0 - 46.0 % 29.4(L)  Platelets 150 - 400 K/uL 292   CMP Latest Ref Rng & Units 11/02/2019 11/01/2019 11/01/2019  Glucose 70 - 99 mg/dL 01/01/2020) 93 -  BUN 6 - 20 mg/dL 956(L) <8(V) -  Creatinine 0.44 - 1.00 mg/dL <5(I 4.33 -  Sodium 2.95 - 145 mmol/L 138 137 -  Potassium 3.5 - 5.1 mmol/L 3.0(L) 3.4(L) -  Chloride 98 - 111 mmol/L 106 104 -  CO2 22 - 32 mmol/L 25 23 -  Calcium 8.9 - 10.3 mg/dL 8.0(L) 8.3(L) -  Total Protein 6.5 - 8.1 g/dL 6.0(L) 6.1(L) -  Total Bilirubin 0.3 - 1.2 mg/dL 5.7(H) 6.8(H) 7.0(H)  Alkaline Phos 38 - 126 U/L 159(H) 178(H) -  AST 15 - 41 U/L 94(H) 113(H) -  ALT 0 - 44 U/L 82(H) 95(H) -    Micro Results: Recent Results (from the past 240 hour(s))  CULTURE, BLOOD  (ROUTINE X 2) w Reflex to ID Panel     Status: None (Preliminary result)   Collection Time: 11/01/19  2:45 PM   Specimen: BLOOD  Result Value Ref Range Status   Specimen Description BLOOD RIGHT ANTECUBITAL  Final   Special Requests   Final    BOTTLES DRAWN AEROBIC AND ANAEROBIC Blood Culture adequate volume   Culture   Final    NO GROWTH < 24 HOURS Performed at [facility], [address] [place], [place]    Report Status PENDING  Incomplete  CULTURE, BLOOD (ROUTINE X 2) w Reflex to ID Panel     Status: None (Preliminary result)   Collection Time: 11/01/19  2:53 PM   Specimen: BLOOD  Result Value Ref Range Status   Specimen Description BLOOD LEFT ANTECUBITAL  Final   Special Requests   Final    BOTTLES DRAWN AEROBIC AND ANAEROBIC Blood Culture adequate volume   Culture   Final    NO GROWTH < 24 HOURS Performed at [facility], [address] [place], [place]    Report Status PENDING  Incomplete  Respiratory Panel by RT PCR (Flu  A&B, Covid) - Nasopharyngeal Swab     Status: None   Collection Time: 11/01/19  2:56 PM   Specimen: Nasopharyngeal Swab  Result Value Ref Range Status   SARS Coronavirus 2 by RT PCR NEGATIVE NEGATIVE Final    Comment: (NOTE) SARS-CoV-2 target nucleic acids are NOT DETECTED. The SARS-CoV-2 RNA is generally detectable in upper respiratoy specimens during the acute phase of infection. The lowest concentration of SARS-CoV-2 viral copies this assay can detect is 131 copies/mL. A negative result does not preclude SARS-Cov-2 infection and should not be used as the sole basis for treatment or other patient management decisions. A negative result may occur with  improper specimen collection/handling, submission of specimen other than nasopharyngeal swab, presence of viral mutation(s) within the areas targeted by this assay, and inadequate number of viral copies (<131 copies/mL). A negative result must be combined with  clinical observations, patient history, and epidemiological information. The expected result is Negative. Fact Sheet for Patients:  https://www.moore.com/ Fact Sheet for Healthcare Providers:  https://www.young.biz/ This test is not yet ap proved or cleared by the [place] FDA and  has been authorized for detection and/or diagnosis of SARS-CoV-2 by FDA under an Emergency Use Authorization (EUA). This EUA will remain  in effect (meaning this test can be used) for the duration of the COVID-19 declaration under Section 564(b)(1) of the Act, 21 U.S.C. section 360bbb-3(b)(1), unless the authorization is terminated or revoked sooner.    Influenza A by PCR NEGATIVE NEGATIVE Final   Influenza B by PCR NEGATIVE NEGATIVE Final    Comment: (NOTE) The Xpert Xpress SARS-CoV-2/FLU/RSV assay is intended as an aid in  the diagnosis of influenza from Nasopharyngeal swab specimens and  should not be used as a sole basis for treatment. Nasal washings and  aspirates are unacceptable for Xpert Xpress SARS-CoV-2/FLU/RSV  testing. Fact Sheet for Patients: https://www.moore.com/ Fact Sheet for Healthcare Providers: https://www.young.biz/ This test is not yet approved or cleared by the [place] FDA and  has been authorized for detection and/or diagnosis of SARS-CoV-2 by  FDA under an Emergency Use Authorization (EUA). This EUA will remain  in effect (meaning this test can be used) for the duration of the  Covid-19 declaration under Section 564(b)(1) of the Act, 21  U.S.C. section 360bbb-3(b)(1), unless the authorization is  terminated or revoked. Performed at [facility], [address] [place], [place] [address]    Studies/Results: [place] Comp + 14 Wk  Result Date: 11/01/2019 CLINICAL DATA:  Substance abuse complicating 2nd trimester pregnancy. Jaundice. EXAM: OBSTETRICAL ULTRASOUND >14 WKS FINDINGS:  Number of Fetuses: 1 Heart Rate:  133 bpm Movement: Yes Presentation: Breech Previa: No Placental Location: Anterior Amniotic Fluid (Subjective): Within normal limits Amniotic Fluid (Objective): Vertical pocket = 7.0cm FETAL BIOMETRY BPD: 6.5cm 26w 1d HC:   24.5cm 26w 4d AC:   21.9cm 26w 3d FL:   4.8cm 25w 6d Current Mean GA: 26w 0d [place] EDC: 02/07/2020 Assigned GA:  25w 6d Assigned EDC: 02/08/2020 FETAL ANATOMY Lateral Ventricles: Appears normal Thalami/CSP: Appears normal Posterior Fossa:  Appears normal Nuchal Region: Appears normal   NFT= N/A > 20 WKS Upper Lip: Appears normal Spine: Appears normal 4 Chamber Heart on Left: Appears normal LVOT: Appears normal RVOT: Appears normal Stomach on Left: Appears normal 3 Vessel Cord: Appears normal Cord Insertion site: Appears normal Kidneys: Appears normal Bladder: Appears normal Extremities: Appears normal Sex: Female Maternal Findings: Cervix:  3.6 cm TA IMPRESSION: Assigned GA currently 25 weeks 6 days.  Appropriate fetal growth. Unremarkable anatomic survey.  No fetal anomalies identified. Electronically Signed   By: Danae Orleans M.D.   On: 11/01/2019 14:42   US RENAL  Result Date: 11/01/2019 CLINICAL DATA:  Flank pain.  Early third trimester gestation EXAM: RENAL / URINARY TRACT ULTRASOUND COMPLETE COMPARISON:  None. FINDINGS: Right Kidney: Renal measurements: 11.6 x 5.4 x 5.3 cm = volume: 174.1 mL . Echogenicity and renal cortical thickness are within normal limits. No mass or perinephric fluid visualized. There is moderate hydronephrosis and proximal ureterectasis on the right. There is a nonobstructing calculus in the mid to lower right kidney measuring 7 mm. No ureteral calculus seen in visualized portions of right ureter. Left Kidney: Renal measurements: 12.0 x 4.7 x 5.0 cm = volume: 145.7 mL. Echogenicity and renal cortical thickness are within normal limits. No mass or perinephric fluid visualized. There is mild hydronephrosis on the left without  ureterectasis. No sonographically demonstrable calculus. Bladder: Appears normal for degree of bladder distention. Flow from each distal ureter cannot be delineated on this study. Other: None. IMPRESSION: 1. Moderate hydronephrosis on the right. Slight hydronephrosis on the left. Proximal ureterectasis noted on the right. No focal obstructing ureteral lesions evident. Fullness of the collecting systems in this circumstance could be due to impression from the intrauterine gestation. Obstructing calculus, particular on the right, cannot be excluded on this study. 2.  Nonobstructing 7 mm calculus mid to lower right kidney. 3.  Study otherwise unremarkable. These results will be called to the ordering clinician or representative by the Radiologist Assistant, and communication documented in the PACS or [employer]. Electronically Signed   By: Bretta Bang III M.D.   On: 11/01/2019 12:22   US Abdomen Limited RUQ  Result Date: 11/01/2019 CLINICAL DATA:  Jaundice. EXAM: ULTRASOUND ABDOMEN LIMITED RIGHT UPPER QUADRANT COMPARISON:  None. FINDINGS: Gallbladder: No gallstones or wall thickening visualized. No sonographic Murphy sign noted by sonographer. Common bile duct: Diameter: 3 mm, within normal limits. Liver: No focal lesion identified. Within normal limits in parenchymal echogenicity. Portal vein is patent on color Doppler imaging with normal direction of blood flow towards the liver. Other: None. IMPRESSION: Negative.  No hepatobiliary abnormality identified. Electronically Signed   By: Danae Orleans M.D.   On: 11/01/2019 14:38   Medications:  I have reviewed the patient's current medications. Prior to Admission:  Medications Prior to Admission  Medication Sig Dispense Refill Last Dose  . Methadone HCl (DOLOPHINE PO) Take 65 mg by mouth daily.   10/29/2019  . Prenatal Vit-Fe Fumarate-FA (PNV PRENATAL PLUS MULTIVITAMIN) 27-1 MG TABS Take by mouth.   10/29/2019  . Prenatal Vit-Fe Fumarate-FA (PRENATAL  MULTIVITAMIN) TABS tablet Take 1 tablet by mouth daily at 12 noon.   Not Taking at Unknown time   Scheduled: . busPIRone  10 mg Oral BID  . ferrous sulfate  325 mg Oral Q breakfast  . gabapentin  300 mg Oral BID  . methadone  32.5 mg Oral Daily  . nicotine  21 mg Transdermal Daily  . potassium chloride  20 mEq Oral Once  . prenatal multivitamin  1 tablet Oral Q1200  . tamsulosin  0.4 mg Oral Daily   Continuous: . sodium chloride 125 mL/hr at 11/02/19 1121  . cefTRIAXone (ROCEPHIN)  IV Stopped (11/01/19 1516)   BBC:[id number], midazolam PF, morphine injection, ondansetron (ZOFRAN) IV, oxyCODONE Anti-infectives (From admission, onward)   Start     Dose/Rate Route Frequency Ordered Stop   11/01/19 1400  cefTRIAXone (ROCEPHIN) 1  g in sodium chloride 0.9 % 100 mL IVPB     1 g 200 mL/hr over 30 Minutes Intravenous Every 24 hours 11/01/19 1340     11/01/19 1330  cefTRIAXone (ROCEPHIN) 1 g in sodium chloride 0.9 % 100 mL IVPB  Status:  Discontinued     1 g 200 mL/hr over 30 Minutes Intravenous  Once 11/01/19 1314 11/01/19 1516     Scheduled Meds: . busPIRone  10 mg Oral BID  . ferrous sulfate  325 mg Oral Q breakfast  . gabapentin  300 mg Oral BID  . methadone  32.5 mg Oral Daily  . nicotine  21 mg Transdermal Daily  . potassium chloride  20 mEq Oral Once  . prenatal multivitamin  1 tablet Oral Q1200  . tamsulosin  0.4 mg Oral Daily   Continuous Infusions: . sodium chloride 125 mL/hr at 11/02/19 1121  . cefTRIAXone (ROCEPHIN)  IV Stopped (11/01/19 1516)   PRN Meds:.cyclobenzaprine, midazolam PF, morphine injection, ondansetron (ZOFRAN) IV, oxyCODONE   Assessment: Principal Problem:   UTI (urinary tract infection) Active Problems:   Abnormal LFTs   IVDU (intravenous drug user)   Hypokalemia   Right flank pain   Hydronephrosis   Normocytic anemia   Pregnancy   Plan: Elevated LFTs, acute hepatitis, without evidence of acute liver failure LFTs are improving,  ultrasound did not reveal any evidence of biliary obstruction MRCP pending, waiting for better control of opioid withdrawal symptoms No evidence of Tylenol toxicity, U tox positive for amphetamines only  Viral hepatitis labs are pending Monitor LFTs closely Avoid hepatotoxic agents MRCP when patient is more stable from opioid withdrawal No indication for antibiotics at this time Monitor closely   LOS: 0 days   Efrain Clauson 11/02/2019, 12:56 PM

## 2019-11-02 NOTE — Progress Notes (Addendum)
CSW received consult for patient who is [redacted] weeks pregnant due to patient having history of IV drug use and 1 PNC visit. Per chart review, patient is followed by [facility] for treatment. CSW spoke with RN who states patient is followed by [facility] for [facility]. Per RN, patient is currently agitated. CSW will complete assessment when appropriate- when patient is more stable.  5/11: Spoke with RN Irving Burton who reports patient is still agitated today. RN to let CSW know if patient is more appropriate and/or needs resources.  Alfonso Ramus, [place] [phone]

## 2019-11-02 NOTE — Progress Notes (Signed)
Patient admitted for worsening LB pain s/t to hydronephrosis s/t obstructive calculi and is [redacted] weeks pregnant presenting w/ jaundiced whites of the eyes and increased LFTs, but appear to be trending down, abdominal US negative, patient also w/ h/o opiate abuse being treated w/ methadone and being seen at methadone clinic.  Per [facility] patient takes the following:  Methadone 65 mg PO x 1 (when patient goes to clinic; aka "full dose") Methadone 32.5 mg PO x 1 (when patient doesn't show up to clinic; aka "half-dose")  Given patient's clinical status w/ elevated LFTs, increase in total bilirubin and increased GGT, will give patient her "half-dose" of methadone which is 32.5 mg given that she has signs of acute hepatotoxcity (which may or may not be from methadone); QTc also 403 on EKG WNL.  Clinic would need to be called again tomorrow to get a recommendation on tomorrow's dose.  [facility]: [phone].  Thomasene Ripple, PharmD, BCPS Clinical Pharmacist 11/02/2019

## 2019-11-02 NOTE — Progress Notes (Signed)
PROGRESS NOTE  Glenda Schwartz YQI:[id number] DOB: [birth year] DOA: 11/01/2019 PCP: Patient, No Pcp Per  Brief History   Glenda Schwartz is an 24 y.o. female tobacco abuse, IV drug abuse, methadone use, 25-week pregnancy, who presents with right flan pain and jaundice.  Pt is 25 weeks of pregnancy.  Patient states that she has been having right-sided flank pain in the past 3 days, which is constant, sharp, 6 out of 10 severity, constant, nonradiating.  She has had dark urine, but no symptoms of UTI.  No hematuria.  She has nausea and vomited once yesterday, but no vomiting today.  She had loose stool bowel movement yesterday, but no diarrhea today.  Denies abdominal pain.  Patient does not have chest pain, cough, shortness of breath.  No fever or chills.  No vaginal bleeding or vaginal discharge.  She feels like her baby is normal. She state that her stool is lighter in color recently.  Her eyes look yellow. She has hx of IV drug use.  She states that she goes to [facility] treatment center for methadone treatment.  Lab: WBC 13.6, urinalysis (cloudy appearance, large amount of leukocyte, many bacteria, WBC>59), lipase 19, UDS positive for methadone and amphetamine, amylase 41, negative HIV antibody, pending COVID-19 PCR, potassium 3.4, renal function okay, abnormal liver function (ALP 178, AST 113, ALT 95, total bilirubin 7.0, direct bilirubin 4.3), platelet 292, temperature normal, blood pressure 108/74, heart rate 86, RR 18.  Gastroenterology and Urology have been consulted. Due to recommendations from pharmacy the patient is receiving 1/2 of her usual dose of methadone. She has been supplemented with morphine to prevent withdrawal.  The patient is refusing MRCP due to anxiety.  Consultants  . Gastroenterology . Urology . Hospitalist medicine.  Procedures  . None  Antibiotics   Anti-infectives (From admission, onward)   Start     Dose/Rate Route Frequency Ordered Stop   11/01/19 1400   cefTRIAXone (ROCEPHIN) 1 g in sodium chloride 0.9 % 100 mL IVPB     1 g 200 mL/hr over 30 Minutes Intravenous Every 24 hours 11/01/19 1340     11/01/19 1330  cefTRIAXone (ROCEPHIN) 1 g in sodium chloride 0.9 % 100 mL IVPB  Status:  Discontinued     1 g 200 mL/hr over 30 Minutes Intravenous  Once 11/01/19 1314 11/01/19 1516    .   Subjective  The patient is somewhat somnolent, but she is complaining of flank pain.  Objective   Vitals:  Vitals:   11/02/19 1147 11/02/19 1608  BP: 115/81 113/75  Pulse: 72 80  Resp: 18 20  Temp: 98 F (36.7 C) 98.5 F (36.9 C)  SpO2: 99% 98%    Exam:  Constitutional:  . The patient is awake, alert, and oriented x 3. No acute distress. Respiratory:  . No increased work of breathing. . No wheezes, rales, or rhonchi . No tactile fremitus Cardiovascular:  . Regular rate and Glenda . No murmurs, ectopy, or gallups. . No lateral PMI. No thrills. Abdomen:  . Abdomen is soft, tender, somewhat distended . No hernias, masses, or organomegaly . Hypoactive bowel sounds.  Musculoskeletal:  . No cyanosis, clubbing, or edema Skin:  . No rashes, lesions, ulcers . palpation of skin: no induration or nodules Neurologic:  . CN 2-12 intact . Sensation all 4 extremities intact Psychiatric:  . Mental status o Mood, affect appropriate o Orientation to person, place, time  . judgment and insight appear intact  I have personally reviewed  the following:   Today's Data  . Vitals, BMP, CBC  Scheduled Meds: . busPIRone  10 mg Oral BID  . ferrous sulfate  325 mg Oral Q breakfast  . gabapentin  300 mg Oral BID  . methadone  32.5 mg Oral Daily  . nicotine  21 mg Transdermal Daily  . prenatal multivitamin  1 tablet Oral Q1200  . tamsulosin  0.4 mg Oral Daily   Continuous Infusions: . sodium chloride 125 mL/hr at 11/02/19 1739  . cefTRIAXone (ROCEPHIN)  IV Stopped (11/02/19 1338)    Principal Problem:   UTI (urinary tract infection) Active  Problems:   Abnormal LFTs   IVDU (intravenous drug user)   Hypokalemia   Right flank pain   Hydronephrosis   Normocytic anemia   Pregnancy   LOS: 0 days   A & P   Hypokalemia: Followed and supplemented overnight. Monitor and continue to supplement.  UTI (urinary tract infection): Continue IV rocephin. Urine culture has grown out more than 100K gram negativie colonies. Blood cultures have had no growth.  Right flank pain due to hydronephrosis: renal US showed moderate Hydronephrosis on the right. Slight hydronephrosis on the left.Proximal ureterectasis noted on the right. Obstructing calculus, particular on the right, cannot be excluded on this study. Nonobstructing 7 mm calculus mid to lower right kidney.Urology has  Been consulted.   Abnormal LFTs: Etiology is not clear. US-RUQ is negative.  HELLP syndrome is a differential diagnosis, but pt dose not have typical presentation. Platelet is normal.  Dr. Tobi Bastos of GI is consulted. Pt has refused MRCP due to anxiety. She is receiving only 1/2 her usual dose of methadone due to pharmacy concerns regarding her liver enzymes. She is being supplemented with morphine to prevent withdrawal.  Hx of IVDU (intravenous drug user):  She is receiving only 1/2 her usual dose of methadone due to pharmacy concerns regarding her liver enzymes. She is being supplemented with morphine to prevent withdrawal. UDS was also positive for methamphatamines.  Normocytic anemia: Continue the patient on iron supplement  Pregnancy: pt is [redacted]W[redacted]D pregancy.  No vaginal bleeding, abdominal pain or vaginal discharge. Management per primary OB team.  I have seen and examined this patient myself. I have spent 38 minutes in her evaluation and care.  Glenda Gebel, DO [facility] Direct contact: see [email or web address]  7PM-7AM contact night coverage as above 11/02/2019, 6:46 PM  LOS: 0 days

## 2019-11-02 NOTE — Progress Notes (Signed)
NST reactive. Some uterine irritability noted. CNM notified. Ok to dc monitoring. Pt taken back to 350 via wheelchair.

## 2019-11-02 NOTE — Progress Notes (Signed)
Patient transported to labor and delivery for NST at this time.   Oswald Hillock, RN

## 2019-11-03 DIAGNOSIS — B1710 Acute hepatitis C without hepatic coma: Secondary | ICD-10-CM

## 2019-11-03 DIAGNOSIS — N1330 Unspecified hydronephrosis: Secondary | ICD-10-CM | POA: Diagnosis present

## 2019-11-03 DIAGNOSIS — M545 Low back pain: Secondary | ICD-10-CM | POA: Diagnosis present

## 2019-11-03 DIAGNOSIS — Z3A26 26 weeks gestation of pregnancy: Secondary | ICD-10-CM | POA: Diagnosis not present

## 2019-11-03 DIAGNOSIS — N1339 Other hydronephrosis: Secondary | ICD-10-CM

## 2019-11-03 DIAGNOSIS — Z20822 Contact with and (suspected) exposure to covid-19: Secondary | ICD-10-CM | POA: Diagnosis present

## 2019-11-03 DIAGNOSIS — R17 Unspecified jaundice: Secondary | ICD-10-CM | POA: Diagnosis not present

## 2019-11-03 DIAGNOSIS — O99332 Smoking (tobacco) complicating pregnancy, second trimester: Secondary | ICD-10-CM | POA: Diagnosis present

## 2019-11-03 DIAGNOSIS — Z5329 Procedure and treatment not carried out because of patient's decision for other reasons: Secondary | ICD-10-CM | POA: Diagnosis present

## 2019-11-03 DIAGNOSIS — O2342 Unspecified infection of urinary tract in pregnancy, second trimester: Secondary | ICD-10-CM | POA: Diagnosis present

## 2019-11-03 DIAGNOSIS — B159 Hepatitis A without hepatic coma: Secondary | ICD-10-CM

## 2019-11-03 DIAGNOSIS — F1123 Opioid dependence with withdrawal: Secondary | ICD-10-CM | POA: Diagnosis present

## 2019-11-03 DIAGNOSIS — O99891 Other specified diseases and conditions complicating pregnancy: Secondary | ICD-10-CM | POA: Diagnosis present

## 2019-11-03 DIAGNOSIS — R945 Abnormal results of liver function studies: Secondary | ICD-10-CM | POA: Diagnosis not present

## 2019-11-03 DIAGNOSIS — O99282 Endocrine, nutritional and metabolic diseases complicating pregnancy, second trimester: Secondary | ICD-10-CM | POA: Diagnosis present

## 2019-11-03 DIAGNOSIS — F17210 Nicotine dependence, cigarettes, uncomplicated: Secondary | ICD-10-CM | POA: Diagnosis present

## 2019-11-03 DIAGNOSIS — O99012 Anemia complicating pregnancy, second trimester: Secondary | ICD-10-CM | POA: Diagnosis present

## 2019-11-03 DIAGNOSIS — O99322 Drug use complicating pregnancy, second trimester: Secondary | ICD-10-CM | POA: Diagnosis present

## 2019-11-03 DIAGNOSIS — E876 Hypokalemia: Secondary | ICD-10-CM | POA: Diagnosis present

## 2019-11-03 LAB — URINE CULTURE: Culture: >=100000 — AB

## 2019-11-03 LAB — COMPREHENSIVE METABOLIC PANEL
ALT: 82 U/L — ABNORMAL HIGH (ref 0–44)
ALT: 89 U/L — ABNORMAL HIGH (ref 0–44)
AST: 110 U/L — ABNORMAL HIGH (ref 15–41)
AST: 142 U/L — ABNORMAL HIGH (ref 15–41)
Albumin: 2.2 g/dL — ABNORMAL LOW (ref 3.5–5.0)
Albumin: 2.1 g/dL — ABNORMAL LOW (ref 3.5–5.0)
Alkaline Phosphatase: 177 U/L — ABNORMAL HIGH (ref 38–126)
Alkaline Phosphatase: 184 U/L — ABNORMAL HIGH (ref 38–126)
Anion gap: 8 (ref 5–15)
Anion gap: 6 (ref 5–15)
BUN: <5 mg/dL — ABNORMAL LOW (ref 6–20)
BUN: <5 mg/dL — ABNORMAL LOW (ref 6–20)
CO2: 21 mmol/L — ABNORMAL LOW (ref 22–32)
CO2: 21 mmol/L — ABNORMAL LOW (ref 22–32)
Calcium: 8.3 mg/dL — ABNORMAL LOW (ref 8.9–10.3)
Calcium: 8.2 mg/dL — ABNORMAL LOW (ref 8.9–10.3)
Chloride: 109 mmol/L (ref 98–111)
Chloride: 111 mmol/L (ref 98–111)
Creatinine, Ser: 0.51 mg/dL (ref 0.44–1.00)
Creatinine, Ser: 0.58 mg/dL (ref 0.44–1.00)
GFR calc Af Amer: >60 mL/min (ref >60)
GFR calc Af Amer: >60 mL/min (ref >60)
GFR calc non Af Amer: >60 mL/min (ref >60)
GFR calc non Af Amer: >60 mL/min (ref >60)
Glucose, Bld: 115 mg/dL — ABNORMAL HIGH (ref 70–99)
Glucose, Bld: 105 mg/dL — ABNORMAL HIGH (ref 70–99)
Potassium: 3.1 mmol/L — ABNORMAL LOW (ref 3.5–5.1)
Potassium: 3.5 mmol/L (ref 3.5–5.1)
Sodium: 138 mmol/L (ref 135–145)
Sodium: 138 mmol/L (ref 135–145)
Total Bilirubin: 5.1 mg/dL — ABNORMAL HIGH (ref 0.3–1.2)
Total Bilirubin: 5.4 mg/dL — ABNORMAL HIGH (ref 0.3–1.2)
Total Protein: 5.9 g/dL — ABNORMAL LOW (ref 6.5–8.1)
Total Protein: 5.8 g/dL — ABNORMAL LOW (ref 6.5–8.1)

## 2019-11-03 LAB — HAPTOGLOBIN: Haptoglobin: 82 mg/dL (ref 33–278)

## 2019-11-03 LAB — HEPATITIS B DNA, ULTRAQUANTITATIVE, PCR
HBV DNA SERPL PCR-ACNC: NOT DETECTED IU/mL
HBV DNA SERPL PCR-LOG IU: UNDETERMINED log10 IU/mL

## 2019-11-03 LAB — CERULOPLASMIN: Ceruloplasmin: 39.3 mg/dL — ABNORMAL HIGH (ref 19.0–39.0)

## 2019-11-03 LAB — HEPATITIS PANEL, ACUTE
HCV Ab: 10.2 s/co ratio — ABNORMAL HIGH (ref 0.0–0.9)
Hep A IgM: POSITIVE — AB
Hep B C IgM: NEGATIVE — AB
Hepatitis B Surface Ag: NEGATIVE — AB

## 2019-11-03 LAB — CBC
HCT: 29.7 % — ABNORMAL LOW (ref 36.0–46.0)
Hemoglobin: 9.7 g/dL — ABNORMAL LOW (ref 12.0–15.0)
MCH: 27.8 pg (ref 26.0–34.0)
MCHC: 32.7 g/dL (ref 30.0–36.0)
MCV: 85.1 fL (ref 80.0–100.0)
Platelets: 275 10*3/uL (ref 150–400)
RBC: 3.49 MIL/uL — ABNORMAL LOW (ref 3.87–5.11)
RDW: 16 % — ABNORMAL HIGH (ref 11.5–15.5)
WBC: 11.2 10*3/uL — ABNORMAL HIGH (ref 4.0–10.5)
nRBC: 0 % (ref 0.0–0.2)

## 2019-11-03 LAB — VARICELLA ZOSTER ANTIBODY, IGG: Varicella IgG: <135 index — ABNORMAL LOW (ref >165)

## 2019-11-03 LAB — MEASLES/MUMPS/RUBELLA IMMUNITY
Mumps IgG: <9 AU/mL — ABNORMAL LOW (ref >10.9)
Rubella: 1.68 index (ref >0.99)
Rubeola IgG: <13.5 AU/mL — ABNORMAL LOW (ref >16.4)

## 2019-11-03 LAB — MITOCHONDRIAL ANTIBODIES: Mitochondrial M2 Ab, IgG: <20 Units (ref 0.0–20.0)

## 2019-11-03 LAB — HEPATITIS B E ANTIGEN: Hep B E Ag: NEGATIVE

## 2019-11-03 LAB — HEPATITIS B E ANTIBODY: Hep B E Ab: NEGATIVE

## 2019-11-03 LAB — PROTIME-INR
INR: 1.1 (ref 0.8–1.2)
INR: 1.1 (ref 0.8–1.2)
Prothrombin Time: 13.5 seconds (ref 11.4–15.2)
Prothrombin Time: 13.3 seconds (ref 11.4–15.2)

## 2019-11-03 LAB — ANTI-SMOOTH MUSCLE ANTIBODY, IGG: F-Actin IgG: 4 Units (ref 0–19)

## 2019-11-03 LAB — HEPATITIS B CORE ANTIBODY, TOTAL: Hep B Core Total Ab: NEGATIVE — AB

## 2019-11-03 LAB — HEPATITIS B SURFACE ANTIGEN: Hepatitis B Surface Ag: NEGATIVE — AB

## 2019-11-03 MED ORDER — POTASSIUM CHLORIDE CRYS ER 20 MEQ PO TBCR
20.0000 meq | EXTENDED_RELEASE_TABLET | ORAL | Status: DC
  Administered 2019-11-03: 20 meq via ORAL
  Filled 2019-11-03: qty 1

## 2019-11-03 NOTE — Progress Notes (Signed)
PROGRESS NOTE  Glenda Schwartz FTD:[id number] DOB: 1996/03/15 DOA: 11/01/2019 PCP: Patient, No Pcp Per  Brief History   Glenda Schwartz is an 24 y.o. female tobacco abuse, IV drug abuse, methadone use, 25-week pregnancy, who presents with right flan pain and jaundice.  Pt is 25 weeks of pregnancy.  Patient states that she has been having right-sided flank pain in the past 3 days, which is constant, sharp, 6 out of 10 severity, constant, nonradiating.  She has had dark urine, but no symptoms of UTI.  No hematuria.  She has nausea and vomited once yesterday, but no vomiting today.  She had loose stool bowel movement yesterday, but no diarrhea today.  Denies abdominal pain.  Patient does not have chest pain, cough, shortness of breath.  No fever or chills.  No vaginal bleeding or vaginal discharge.  She feels like her baby is normal. She state that her stool is lighter in color recently.  Her eyes look yellow. She has hx of IV drug use.  She states that she goes to [facility] treatment center for methadone treatment.  Lab: WBC 13.6, urinalysis (cloudy appearance, large amount of leukocyte, many bacteria, WBC>59), lipase 19, UDS positive for methadone and amphetamine, amylase 41, negative HIV antibody, pending COVID-19 PCR, potassium 3.4, renal function okay, abnormal liver function (ALP 178, AST 113, ALT 95, total bilirubin 7.0, direct bilirubin 4.3), platelet 292, temperature normal, blood pressure 108/74, heart rate 86, RR 18.  Gastroenterology and Urology have been consulted. Due to recommendations from pharmacy the patient is receiving 1/2 of her usual dose of methadone. She has been supplemented with morphine to prevent withdrawal. The patient states that this is "nothing". She wants to smoke.  MRCP is negative.  Urology has recommended conservative management and reconsult them if the patient has worsening of renal status. They feel that hydroureter is likely pregnancy related.   Consultants   . Gastroenterology . Urology . Hospitalist medicine.  Procedures  . None  Antibiotics   Anti-infectives (From admission, onward)   Start     Dose/Rate Route Frequency Ordered Stop   11/01/19 1400  cefTRIAXone (ROCEPHIN) 1 g in sodium chloride 0.9 % 100 mL IVPB     1 g 200 mL/hr over 30 Minutes Intravenous Every 24 hours 11/01/19 1340     11/01/19 1330  cefTRIAXone (ROCEPHIN) 1 g in sodium chloride 0.9 % 100 mL IVPB  Status:  Discontinued     1 g 200 mL/hr over 30 Minutes Intravenous  Once 11/01/19 1314 11/01/19 1516      Subjective  The patient is somewhat somnolent, but she is complaining of flank pain.  Objective   Vitals:  Vitals:   11/03/19 0429 11/03/19 0742  BP: (!) 99/58 106/66  Pulse: 82 78  Resp: 18 18  Temp: 98.4 F (36.9 C) 98.6 F (37 C)  SpO2: 98% 100%    Exam:  Constitutional:  . The patient is awake, alert, and oriented x 3. No acute distress. Respiratory:  . No increased work of breathing. . No wheezes, rales, or rhonchi . No tactile fremitus Cardiovascular:  . Regular rate and rhythm . No murmurs, ectopy, or gallups. . No lateral PMI. No thrills. Abdomen:  . Abdomen is soft, tender, somewhat distended . No hernias, masses, or organomegaly . Hypoactive bowel sounds.  Musculoskeletal:  . No cyanosis, clubbing, or edema Skin:  . No rashes, lesions, ulcers . palpation of skin: no induration or nodules Neurologic:  . CN 2-12 intact .  Sensation all 4 extremities intact Psychiatric:  . Mental status o Mood, affect appropriate o Orientation to person, place, time  . judgment and insight appear intact  I have personally reviewed the following:   Today's Data  . Vitals, BMP, CBC  Scheduled Meds: . busPIRone  10 mg Oral BID  . ferrous sulfate  325 mg Oral Q breakfast  . gabapentin  300 mg Oral BID  . methadone  32.5 mg Oral Daily  . nicotine  21 mg Transdermal Daily  . potassium chloride  20 mEq Oral Q4H  . prenatal multivitamin   1 tablet Oral Q1200  . tamsulosin  0.4 mg Oral Daily   Continuous Infusions: . sodium chloride 125 mL/hr at 11/02/19 2256  . cefTRIAXone (ROCEPHIN)  IV Stopped (11/02/19 1338)    Principal Problem:   UTI (urinary tract infection) Active Problems:   Abnormal LFTs   IVDU (intravenous drug user)   Hypokalemia   Right flank pain   Hydronephrosis   Normocytic anemia   Pregnancy   LOS: 0 days   A & P   Hypokalemia: Followed and supplemented overnight. Monitor and continue to supplement.  UTI (urinary tract infection): Continue IV rocephin. Urine culture has grown out more than 100K gram negativie colonies. Blood cultures have had no growth. Will convert to oral antibiotics.  Right flank pain due to hydronephrosis: renal US showed moderate Hydronephrosis on the right. Slight hydronephrosis on the left.Proximal ureterectasis noted on the right. Obstructing calculus, particular on the right, cannot be excluded on this study. Nonobstructing 7 mm calculus mid to lower right kidney.Urology has  Been consulted. They have recommended conservative management only and that they be consulted only if creatinine increases.  Abnormal LFTs: Etiology is not clear. US-RUQ is negative. MRCP is negative. HELLP syndrome is a differential diagnosis, but pt dose not have typical presentation. Platelet is normal.  Dr. Vicente Males of GI is consulted. MRCP is unremarkable.  She is receiving only 1/2 her usual dose of methadone due to pharmacy concerns regarding her liver enzymes. She is being supplemented with morphine to prevent withdrawal. Liver enzymes are decreased minimally.   Hx of IVDU (intravenous drug user):  She is receiving only 1/2 her usual dose of methadone due to pharmacy concerns regarding her liver enzymes. She is being supplemented with morphine to prevent withdrawal. UDS was also positive for amphatamines.  Normocytic anemia: Continue the patient on iron supplement  Pregnancy: pt is [redacted]W[redacted]D  pregancy.  No vaginal bleeding, abdominal pain or vaginal discharge. Management per primary OB team.  I have seen and examined this patient myself. I have spent 35 minutes in her evaluation and care.  Thank you for allowing me to participate in the care of this interesting patient.  Jahziah Simonin, DO [facility] Direct contact: see [email or web address]  7PM-7AM contact night coverage as above 11/03/2019, 12:12 PM  LOS: 0 days

## 2019-11-03 NOTE — Progress Notes (Signed)
Pt refused labs to be drawn by lab. Told them to "come back". RN spoke with her and said it was important for these to be drawn in order to properly diagnose what was going on. She said she did not care she was withdrawing and anxious and would let lab know when it was ok for them to back. RN spoke with lab tech and agreed to call them to come back when the time was right for the patient, per the patient.

## 2019-11-03 NOTE — Progress Notes (Signed)
Pt has stated multiple times today that she wants to leave AMA because we are not providing answers for her about her care and she wants to go to [facility]. Pt was given answers by the GI doctor on recommended plan of care. Pt verbalized understanding. Pt called RN to the room and said that she would like to leave AMA because really we weren't doing anything for her and things could not be done if she has hepatitis C until she has the baby. She also stated that her boyfriend was coming and would be here soon, and would like to talk to him first before she made this decision. The boyfriend arrived and the RN entered the room about 30 minutes later to find the patient not able to barely hold a conversation or keep her eyes open,  (she was not like this before he arrived, and RN had not given her any type of medication to put her in an almost sedated state). The boyfriend was fidgety all over the room. After attempting to conversate with the patient the patient stated that she wanted to get her IV antibiotic, eat dinner then leave AMA. Midwife notified and in route to speak with patient.

## 2019-11-03 NOTE — Progress Notes (Signed)
ANTEPARTUM PROGRESS NOTE  Glenda Schwartz is a 24 y.o. G3P0020 at [redacted]w[redacted]d who is admitted for UTI, elevated LFT's/billirubin with jaundice .   Estimated Date of Delivery: 02/08/20  Length of Stay:  1 Days. Admitted 11/01/2019  Subjective: Reports feeling anxious and stressed.  She is requesting to discharge.  Her dosage of methadone is a major source of stress.  She states that the  dose of methadone was not enough to begin with and she was having to use heroin on top of the methadone d/t withdrawal symptoms.  States that she does not want to use heroin but she does not want the withdrawal symptoms.    She reports:  -active fetal movement -no leakage of fluid -no vaginal bleeding -no contractions  Vitals:  BP 106/66 (BP Location: Left Arm)   Pulse 78   Temp 98.6 F (37 C) (Oral)   Resp 18   Ht  (1.575 m)   Wt 59 kg   SpO2 100%   BMI 23.78 kg/m  Physical Examination: General:   anxious, fidgety  Skin Jaundice present   Neurologic:    Alert & oriented x 3  Lungs:   clear to auscultation bilaterally  Heart:   regular rate and rhythm, S1, S2 normal, no murmur, click, rub or gallop  Abdomen:  soft, non-tender; bowel sounds normal; no masses,  no organomegaly and gravid  Pelvis:  Exam deferred.  FHT:  declined   Extremities: : non-tender, symmetric, No edema bilaterally.  DTRs: 2+/2+     Results for orders placed or performed during the hospital encounter of 11/01/19 (from the past 48 hour(s))  Type and screen [facility]     Status: None   Collection Time: 11/02/19 12:09 AM  Result Value Ref Range   ABO/RH(D) O POS    Antibody Screen NEG    Sample Expiration      11/05/2019,2359 Performed at [facility], [address] [place], [place] [address]   Hepatitis B core antibody, total     Status: Abnormal   Collection Time: 11/02/19 12:09 AM  Result Value Ref Range   Hep B Core Total Ab NEGATIVE (A) NON REACTIVE    Comment: Performed at  [employer] Performed at [facility], [address] [place], [place] [address]   Hepatitis B DNA, ultraquantitative, PCR     Status: None   Collection Time: 11/02/19 12:09 AM  Result Value Ref Range   HBV DNA SERPL PCR-ACNC HBV DNA not detected IU/mL   HBV DNA SERPL PCR-LOG IU UNABLE TO CALCULATE log10 IU/mL    Comment: (NOTE) Unable to calculate result since non-numeric result obtained for component test.    Test Info: Comment     Comment: (NOTE) The reportable range for this assay is 10 IU/mL to 1 billion IU/mL. Performed At: [facility] [address] [place], [place] [id number] Jolene Schimke MD WG:[id number]   Hepatitis B e antibody     Status: None   Collection Time: 11/02/19 12:09 AM  Result Value Ref Range   Hep B E Ab Negative Negative    Comment: (NOTE) Performed At: [facility] [address] [place], [place] [id number] Jolene Schimke MD MW:[id number]   Hepatitis B E antigen     Status: None   Collection Time: 11/02/19 12:09 AM  Result Value Ref Range   Hep B E Ag Negative Negative    Comment: (NOTE) Performed At: [facility] [address]  [place], [place] [id number] Rush Farmer MD FO:[id number]   Hepatitis B surface antigen     Status: Abnormal   Collection Time: 11/02/19 12:09 AM  Result Value Ref Range   Hepatitis B Surface Ag NEGATIVE (A) NON REACTIVE    Comment: Performed at [employer] Performed at [facility], [place] [address] [place], [place] [address]   Ceruloplasmin     Status: Abnormal   Collection Time: 11/02/19 12:09 AM  Result Value Ref Range   Ceruloplasmin 39.3 (H) 19.0 - 39.0 mg/dL    Comment: (NOTE) Performed At: [facility] [place], [place] [id number] Rush Farmer MD MO:[id number]   Iron and TIBC     Status: Abnormal   Collection Time: 11/02/19 12:09 AM  Result Value Ref Range   Iron 33 28 - 170 ug/dL   TIBC 416 250 - 450 ug/dL   Saturation  Ratios 8 (L) 10.4 - 31.8 %   UIBC 383 ug/dL    Comment: Performed at [facility], [address] [place], [place] [address]  Ferritin     Status: None   Collection Time: 11/02/19 12:09 AM  Result Value Ref Range   Ferritin 41 11 - 307 ng/mL    Comment: Performed at [facility], [address] [place], [place] [address]  Gamma GT     Status: Abnormal   Collection Time: 11/02/19 12:09 AM  Result Value Ref Range   GGT 92 (H) 7 - 50 U/L    Comment: Performed at [facility], [place] [address], [place], [place] [address]  CK     Status: Abnormal   Collection Time: 11/02/19 12:09 AM  Result Value Ref Range   Total CK 28 (L) 38 - 234 U/L    Comment: Performed at [facility], [address] [place], [place] [address]  Acetaminophen level     Status: Abnormal   Collection Time: 11/02/19 12:09 AM  Result Value Ref Range   Acetaminophen (Tylenol), Serum <10 (L) 10 - 30 ug/mL    Comment: (NOTE) Therapeutic concentrations vary significantly. A range of 10-30 ug/mL  may be an effective concentration for many patients. However, some  are best treated at concentrations outside of this range. Acetaminophen concentrations >150 ug/mL at 4 hours after ingestion  and >50 ug/mL at 12 hours after ingestion are often associated with  toxic reactions. Performed at [facility], [address] [place], [place] [address]   Haptoglobin     Status: None   Collection Time: 11/02/19 12:09 AM  Result Value Ref Range   Haptoglobin 82 33 - 278 mg/dL    Comment: (NOTE) Performed At: [facility] [place], [place] [id number] Rush Farmer MD [id number]   Anti-smooth muscle antibody, IgG     Status: None   Collection Time: 11/02/19 12:09 AM  Result Value Ref Range   F-Actin IgG 4 0 - 19 Units    Comment: (NOTE)                 Negative                     0 - 19                 Weak positive               20 - 30  Moderate to strong positive     >30 Actin Antibodies are found in 52-85% of patients with autoimmune hepatitis or chronic active hepatitis and in 22% of patients with primary biliary cirrhosis. Performed At: [facility] [address] [place], [place] [id number] Jolene Schimke MD VO:[id number]   Mitochondrial antibodies     Status: None   Collection Time: 11/02/19 12:09 AM  Result Value Ref Range   Mitochondrial M2 Ab, IgG <20.0 0.0 - 20.0 Units    Comment: (NOTE)                                Negative    0.0 - 20.0                                Equivocal  20.1 - 24.9                                Positive         >24.9 Mitochondrial (M2) Antibodies are found in 90-96% of patients with primary biliary cirrhosis. Performed At: [facility] [address] [place], [place] [id number] Jolene Schimke MD FI:[id number]   TSH     Status: None   Collection Time: 11/02/19 12:09 AM  Result Value Ref Range   TSH 0.352 0.350 - 4.500 uIU/mL    Comment: Performed by a 3rd Generation assay with a functional sensitivity of <=0.01 uIU/mL. Performed at [facility], [address] [place], [place] [address]   Uric acid     Status: None   Collection Time: 11/02/19 12:09 AM  Result Value Ref Range   Uric Acid, Serum 4.6 2.5 - 7.1 mg/dL    Comment: ICTERUS AT THIS LEVEL MAY AFFECT RESULT Performed at [facility], [address] [place], [place] [address]   Comprehensive metabolic panel     Status: Abnormal   Collection Time: 11/02/19 12:09 AM  Result Value Ref Range   Sodium 138 135 - 145 mmol/L   Potassium 3.0 (L) 3.5 - 5.1 mmol/L   Chloride 106 98 - 111 mmol/L   CO2 25 22 - 32 mmol/L   Glucose, Bld 104 (H) 70 - 99 mg/dL    Comment: Glucose reference range applies only to samples taken after fasting for at least 8 hours.   BUN <5 (L) 6 - 20 mg/dL   Creatinine, Ser 1.09 0.44 - 1.00 mg/dL   Calcium 8.0 (L) 8.9 - 10.3 mg/dL   Total Protein 6.0 (L)  6.5 - 8.1 g/dL   Albumin 2.3 (L) 3.5 - 5.0 g/dL   AST 94 (H) 15 - 41 U/L   ALT 82 (H) 0 - 44 U/L   Alkaline Phosphatase 159 (H) 38 - 126 U/L   Total Bilirubin 5.7 (H) 0.3 - 1.2 mg/dL   GFR calc non Af Amer >60 >60 mL/min   GFR calc Af Amer >60 >60 mL/min   Anion gap 7 5 - 15    Comment: Performed at [facility], [address] [place], [place] [address]  Protime-INR     Status: None   Collection Time: 11/02/19 12:09 AM  Result Value Ref Range   Prothrombin Time 14.1 11.4 - 15.2 seconds   INR 1.1 0.8 - 1.2    Comment: (NOTE) INR goal varies based  on device and disease states. Performed at [facility], [address] [place], [place] [address]   Comprehensive metabolic panel     Status: Abnormal   Collection Time: 11/02/19 12:30 PM  Result Value Ref Range   Sodium 138 135 - 145 mmol/L   Potassium 3.4 (L) 3.5 - 5.1 mmol/L   Chloride 109 98 - 111 mmol/L   CO2 23 22 - 32 mmol/L   Glucose, Bld 93 70 - 99 mg/dL    Comment: Glucose reference range applies only to samples taken after fasting for at least 8 hours.   BUN <5 (L) 6 - 20 mg/dL   Creatinine, Ser 9.62 0.44 - 1.00 mg/dL   Calcium 7.9 (L) 8.9 - 10.3 mg/dL   Total Protein 6.0 (L) 6.5 - 8.1 g/dL   Albumin 2.3 (L) 3.5 - 5.0 g/dL   AST 952 (H) 15 - 41 U/L   ALT 87 (H) 0 - 44 U/L   Alkaline Phosphatase 174 (H) 38 - 126 U/L   Total Bilirubin 5.3 (H) 0.3 - 1.2 mg/dL   GFR calc non Af Amer >60 >60 mL/min   GFR calc Af Amer >60 >60 mL/min   Anion gap 6 5 - 15    Comment: Performed at [facility], [address] [place], [place] [address]  Protime-INR     Status: None   Collection Time: 11/02/19 12:30 PM  Result Value Ref Range   Prothrombin Time 13.7 11.4 - 15.2 seconds   INR 1.1 0.8 - 1.2    Comment: (NOTE) INR goal varies based on device and disease states. Performed at [facility], [address] [place], [place] [address]   CBC     Status: Abnormal   Collection Time:  11/02/19 12:30 PM  Result Value Ref Range   WBC 9.4 4.0 - 10.5 K/uL   RBC 3.57 (L) 3.87 - 5.11 MIL/uL   Hemoglobin 10.0 (L) 12.0 - 15.0 g/dL   HCT 27.2 (L) 53.6 - 64.4 %   MCV 83.2 80.0 - 100.0 fL   MCH 28.0 26.0 - 34.0 pg   MCHC 33.7 30.0 - 36.0 g/dL   RDW 03.4 (H) 74.2 - 59.5 %   Platelets 307 150 - 400 K/uL   nRBC 0.0 0.0 - 0.2 %    Comment: Performed at [facility], [address], [place], [place] [address]  Basic metabolic panel     Status: Abnormal   Collection Time: 11/02/19 12:30 PM  Result Value Ref Range   Sodium 138 135 - 145 mmol/L   Potassium 3.1 (L) 3.5 - 5.1 mmol/L   Chloride 109 98 - 111 mmol/L   CO2 24 22 - 32 mmol/L   Glucose, Bld 95 70 - 99 mg/dL    Comment: Glucose reference range applies only to samples taken after fasting for at least 8 hours.   BUN <5 (L) 6 - 20 mg/dL   Creatinine, Ser 6.43 0.44 - 1.00 mg/dL   Calcium 8.0 (L) 8.9 - 10.3 mg/dL   GFR calc non Af Amer >60 >60 mL/min   GFR calc Af Amer >60 >60 mL/min   Anion gap 5 5 - 15    Comment: Performed at [facility], [address] [place], [place] [address]  Comprehensive metabolic panel     Status: Abnormal   Collection Time: 11/03/19 12:46 AM  Result Value Ref Range   Sodium 138 135 - 145 mmol/L   Potassium 3.1 (L)  3.5 - 5.1 mmol/L   Chloride 109 98 - 111 mmol/L   CO2 21 (L) 22 - 32 mmol/L   Glucose, Bld 115 (H) 70 - 99 mg/dL    Comment: Glucose reference range applies only to samples taken after fasting for at least 8 hours.   BUN <5 (L) 6 - 20 mg/dL   Creatinine, Ser 2.84 0.44 - 1.00 mg/dL   Calcium 8.3 (L) 8.9 - 10.3 mg/dL   Total Protein 5.9 (L) 6.5 - 8.1 g/dL   Albumin 2.2 (L) 3.5 - 5.0 g/dL   AST 132 (H) 15 - 41 U/L   ALT 82 (H) 0 - 44 U/L   Alkaline Phosphatase 177 (H) 38 - 126 U/L   Total Bilirubin 5.1 (H) 0.3 - 1.2 mg/dL   GFR calc non Af Amer >60 >60 mL/min   GFR calc Af Amer >60 >60 mL/min   Anion gap 8 5 - 15    Comment: Performed at [facility], [address] [place], [place] [address]  Protime-INR     Status: None   Collection Time: 11/03/19 12:46 AM  Result Value Ref Range   Prothrombin Time 13.5 11.4 - 15.2 seconds   INR 1.1 0.8 - 1.2    Comment: (NOTE) INR goal varies based on device and disease states. Performed at [facility], [address] [place], [place] [address]   CBC     Status: Abnormal   Collection Time: 11/03/19 12:46 AM  Result Value Ref Range   WBC 11.2 (H) 4.0 - 10.5 K/uL   RBC 3.49 (L) 3.87 - 5.11 MIL/uL   Hemoglobin 9.7 (L) 12.0 - 15.0 g/dL   HCT 66.4 (L) 40.3 - 47.4 %   MCV 85.1 80.0 - 100.0 fL   MCH 27.8 26.0 - 34.0 pg   MCHC 32.7 30.0 - 36.0 g/dL   RDW 25.9 (H) 56.3 - 87.5 %   Platelets 275 150 - 400 K/uL   nRBC 0.0 0.0 - 0.2 %    Comment: Performed at [facility], [address], [place], [place] [address]  Comprehensive metabolic panel     Status: Abnormal   Collection Time: 11/03/19  3:08 PM  Result Value Ref Range   Sodium 138 135 - 145 mmol/L   Potassium 3.5 3.5 - 5.1 mmol/L   Chloride 111 98 - 111 mmol/L   CO2 21 (L) 22 - 32 mmol/L   Glucose, Bld 105 (H) 70 - 99 mg/dL    Comment: Glucose reference range applies only to samples taken after fasting for at least 8 hours.   BUN <5 (L) 6 - 20 mg/dL   Creatinine, Ser 9.51 0.44 - 1.00 mg/dL   Calcium 8.2 (L) 8.9 - 10.3 mg/dL   Total Protein 5.8 (L) 6.5 - 8.1 g/dL   Albumin 2.1 (L) 3.5 - 5.0 g/dL   AST 884 (H) 15 - 41 U/L   ALT 89 (H) 0 - 44 U/L   Alkaline Phosphatase 184 (H) 38 - 126 U/L   Total Bilirubin 5.4 (H) 0.3 - 1.2 mg/dL   GFR calc non Af Amer >60 >60 mL/min   GFR calc Af Amer >60 >60 mL/min   Anion gap 6 5 - 15    Comment: Performed at [facility], [address] [place], [place] [address]  Protime-INR     Status: None   Collection Time: 11/03/19  3:08 PM  Result Value Ref Range   Prothrombin Time 13.3  11.4 - 15.2 seconds   INR 1.1 0.8 - 1.2    Comment: (NOTE) INR goal varies based  on device and disease states. Performed at [facility], [address] [place], [place] [id number]     MR ABDOMEN MRCP WO CONTRAST  Result Date: 11/03/2019 CLINICAL DATA:  Right flank pain and jaundice. Twenty-five weeks pregnant. EXAM: MRI ABDOMEN WITHOUT CONTRAST  (INCLUDING MRCP) TECHNIQUE: Multiplanar multisequence MR imaging of the abdomen was performed. Heavily T2-weighted images of the biliary and pancreatic ducts were obtained, and three-dimensional MRCP images were rendered by post processing. COMPARISON:  Abdominal ultrasound 11/03/2019 FINDINGS: Lower chest: No acute findings. Hepatobiliary: No mass or other parenchymal abnormality identified. No cholelithiasis. No biliary dilatation. Pancreas: No mass, inflammatory changes, or other parenchymal abnormality identified. Spleen:  Within normal limits in size and appearance. Adrenals/Urinary Tract: Normal adrenal glands. There is moderate right hydroureteronephrosis. Stomach/Bowel: Stomach is distended with heterogeneous contents. Vascular/Lymphatic: No pathologically enlarged lymph nodes identified. No abdominal aortic aneurysm demonstrated. Other:  None. Musculoskeletal: No suspicious bone lesions identified. IMPRESSION: 1. No acute findings within the abdomen. 2. No cholelithiasis, choledocholithiasis or biliary dilatation. 3. Moderate right hydroureteronephrosis, commonly seen in pregnancy. Electronically Signed   By: Deatra RobinsonKevin  Herman M.D.   On: 11/03/2019 00:31   MR 3D Recon At Scanner  Result Date: 11/03/2019 CLINICAL DATA:  Right flank pain and jaundice. Twenty-five weeks pregnant. EXAM: MRI ABDOMEN WITHOUT CONTRAST  (INCLUDING MRCP) TECHNIQUE: Multiplanar multisequence MR imaging of the abdomen was performed. Heavily T2-weighted images of the biliary and pancreatic ducts were obtained, and three-dimensional MRCP images were rendered by post processing. COMPARISON:  Abdominal ultrasound 11/03/2019 FINDINGS: Lower chest: No acute  findings. Hepatobiliary: No mass or other parenchymal abnormality identified. No cholelithiasis. No biliary dilatation. Pancreas: No mass, inflammatory changes, or other parenchymal abnormality identified. Spleen:  Within normal limits in size and appearance. Adrenals/Urinary Tract: Normal adrenal glands. There is moderate right hydroureteronephrosis. Stomach/Bowel: Stomach is distended with heterogeneous contents. Vascular/Lymphatic: No pathologically enlarged lymph nodes identified. No abdominal aortic aneurysm demonstrated. Other:  None. Musculoskeletal: No suspicious bone lesions identified. IMPRESSION: 1. No acute findings within the abdomen. 2. No cholelithiasis, choledocholithiasis or biliary dilatation. 3. Moderate right hydroureteronephrosis, commonly seen in pregnancy. Electronically Signed   By: Deatra RobinsonKevin  Herman M.D.   On: 11/03/2019 00:31    Current scheduled medications . busPIRone  10 mg Oral BID  . ferrous sulfate  325 mg Oral Q breakfast  . gabapentin  300 mg Oral BID  . methadone  32.5 mg Oral Daily  . nicotine  21 mg Transdermal Daily  . prenatal multivitamin  1 tablet Oral Q1200  . tamsulosin  0.4 mg Oral Daily    I have reviewed the patient's current medications.  ASSESSMENT: Patient Active Problem List   Diagnosis Date Noted  . Low back pain during pregnancy 11/01/2019  . UTI (urinary tract infection) 11/01/2019  . Abnormal LFTs 11/01/2019  . IVDU (intravenous drug user) 11/01/2019  . Hypokalemia 11/01/2019  . Right flank pain 11/01/2019  . Hydronephrosis 11/01/2019  . Normocytic anemia 11/01/2019  . Pregnancy 11/01/2019    PLAN: Dorathy DaftKayla is requesting to discharge.  Reviewed that GI specialist recommends to follow LFT's inpatient for one more night.  There is concern that methadone could be exacerbating liver disease.  Strongly encouraged to stay inpatient for pain management, management of withdrawal symptoms, antibiotic treatment, and monitoring of LFT's.    Dorathy DaftKayla  again stated that she needed to leave, the methadone dosage  was not enough for her symptoms and she needed to treat her withdrawal symptoms.  Advised that she could leave the hospital at any point but with would be against medical advice. She will let her nurse know if she wants to leave AMA.  Dr. Dalbert Garnet updated on plan of care.     Margaretmary Eddy, CNM Certified Nurse Midwife [facility]

## 2019-11-03 NOTE — Progress Notes (Addendum)
Pt left AMA, asked if [facility] could see her chart and treatment of care on the way out. AMA papers signed and IV removed. Pt said she was ok to walk out

## 2019-11-03 NOTE — Progress Notes (Signed)
Glenda Darby, MD [address]  [place]  [place], [place] [address]  Main: [phone]  Fax: [phone] Pager: [phone]   Subjective: Patient is going through opioid withdrawal symptoms. No acute events overnight, patient underwent MRCP today   Objective: Vital signs in last 24 hours: Vitals:   11/03/19 0023 11/03/19 0100 11/03/19 0429 11/03/19 0742  BP: 110/64  (!) 99/58 106/66  Pulse: 85 80 82 78  Resp: 18  18 18   Temp: 98.3 F (36.8 C)  98.4 F (36.9 C) 98.6 F (37 C)  TempSrc: Oral  Oral Oral  SpO2: 98% 98% 98% 100%  Weight:      Height:       Weight change:   Intake/Output Summary (Last 24 hours) at 11/03/2019 1454 Last data filed at 11/03/2019 0300 Gross per 24 hour  Intake 954.83 ml  Output 1600 ml  Net -645.17 ml     Exam: Heart:: Regular rate and rhythm, S1S2 present or without murmur or extra heart sounds Lungs: normal and clear to auscultation Abdomen: soft, nontender, normal bowel sounds   Lab Results: CBC Latest Ref Rng & Units 11/03/2019 11/02/2019 11/01/2019  WBC 4.0 - 10.5 K/uL 11.2(H) 9.4 13.6(H)  Hemoglobin 12.0 - 15.0 g/dL 9.7(L) 10.0(L) 10.0(L)  Hematocrit 36.0 - 46.0 % 29.7(L) 29.7(L) 29.4(L)  Platelets 150 - 400 K/uL 275 307 292   CMP Latest Ref Rng & Units 11/03/2019 11/02/2019 11/02/2019  Glucose 70 - 99 mg/dL 115(H) 93 95  BUN 6 - 20 mg/dL <5(L) <5(L) <5(L)  Creatinine 0.44 - 1.00 mg/dL 0.51 0.46 0.50  Sodium 135 - 145 mmol/L 138 138 138  Potassium 3.5 - 5.1 mmol/L 3.1(L) 3.4(L) 3.1(L)  Chloride 98 - 111 mmol/L 109 109 109  CO2 22 - 32 mmol/L 21(L) 23 24  Calcium 8.9 - 10.3 mg/dL 8.3(L) 7.9(L) 8.0(L)  Total Protein 6.5 - 8.1 g/dL 5.9(L) 6.0(L) -  Total Bilirubin 0.3 - 1.2 mg/dL 5.1(H) 5.3(H) -  Alkaline Phos 38 - 126 U/L 177(H) 174(H) -  AST 15 - 41 U/L 110(H) 118(H) -  ALT 0 - 44 U/L 82(H) 87(H) -    Micro Results: Recent Results (from the past 240 hour(s))  Urine Culture     Status: Abnormal   Collection  Time: 11/01/19 11:43 AM   Specimen: Urine, Random  Result Value Ref Range Status   Specimen Description   Final    URINE, RANDOM Performed at [facility], [address] [place], [place] [address]    Special Requests   Final    NONE Performed at [facility], [address] [place], [place] [address]    Culture >=100,000 COLONIES/mL ESCHERICHIA COLI (A)  Final   Report Status 11/03/2019 FINAL  Final   Organism ID, Bacteria ESCHERICHIA COLI (A)  Final      Susceptibility   Escherichia coli - MIC*    AMPICILLIN 8 SENSITIVE Sensitive     CEFAZOLIN <=4 SENSITIVE Sensitive     CEFTRIAXONE <=1 SENSITIVE Sensitive     CIPROFLOXACIN <=0.25 SENSITIVE Sensitive     GENTAMICIN <=1 SENSITIVE Sensitive     IMIPENEM <=0.25 SENSITIVE Sensitive     NITROFURANTOIN <=16 SENSITIVE Sensitive     TRIMETH/SULFA <=20 SENSITIVE Sensitive     AMPICILLIN/SULBACTAM 4 SENSITIVE Sensitive     PIP/TAZO <=4 SENSITIVE Sensitive     * >=100,000 COLONIES/mL ESCHERICHIA COLI  CULTURE, BLOOD (ROUTINE X 2) w Reflex to ID Panel  Status: None (Preliminary result)   Collection Time: 11/01/19  2:45 PM   Specimen: BLOOD  Result Value Ref Range Status   Specimen Description BLOOD RIGHT ANTECUBITAL  Final   Special Requests   Final    BOTTLES DRAWN AEROBIC AND ANAEROBIC Blood Culture adequate volume   Culture   Final    NO GROWTH 2 DAYS Performed at [facility], [address], [place], [place] [address]    Report Status PENDING  Incomplete  CULTURE, BLOOD (ROUTINE X 2) w Reflex to ID Panel     Status: None (Preliminary result)   Collection Time: 11/01/19  2:53 PM   Specimen: BLOOD  Result Value Ref Range Status   Specimen Description BLOOD LEFT ANTECUBITAL  Final   Special Requests   Final    BOTTLES DRAWN AEROBIC AND ANAEROBIC Blood Culture adequate volume   Culture   Final    NO GROWTH 2 DAYS Performed at [facility], [address] [place], [place] [address]     Report Status PENDING  Incomplete  Respiratory Panel by RT PCR (Flu A&B, Covid) - Nasopharyngeal Swab     Status: None   Collection Time: 11/01/19  2:56 PM   Specimen: Nasopharyngeal Swab  Result Value Ref Range Status   SARS Coronavirus 2 by RT PCR NEGATIVE NEGATIVE Final    Comment: (NOTE) SARS-CoV-2 target nucleic acids are NOT DETECTED. The SARS-CoV-2 RNA is generally detectable in upper respiratoy specimens during the acute phase of infection. The lowest concentration of SARS-CoV-2 viral copies this assay can detect is 131 copies/mL. A negative result does not preclude SARS-Cov-2 infection and should not be used as the sole basis for treatment or other patient management decisions. A negative result may occur with  improper specimen collection/handling, submission of specimen other than nasopharyngeal swab, presence of viral mutation(s) within the areas targeted by this assay, and inadequate number of viral copies (<131 copies/mL). A negative result must be combined with clinical observations, patient history, and epidemiological information. The expected result is Negative. Fact Sheet for Patients:  https://www.moore.com/ Fact Sheet for Healthcare Providers:  https://www.young.biz/ This test is not yet ap proved or cleared by the [place] FDA and  has been authorized for detection and/or diagnosis of SARS-CoV-2 by FDA under an Emergency Use Authorization (EUA). This EUA will remain  in effect (meaning this test can be used) for the duration of the COVID-19 declaration under Section 564(b)(1) of the Act, 21 U.S.C. section 360bbb-3(b)(1), unless the authorization is terminated or revoked sooner.    Influenza A by PCR NEGATIVE NEGATIVE Final   Influenza B by PCR NEGATIVE NEGATIVE Final    Comment: (NOTE) The Xpert Xpress SARS-CoV-2/FLU/RSV assay is intended as an aid in  the diagnosis of influenza from Nasopharyngeal swab specimens  and  should not be used as a sole basis for treatment. Nasal washings and  aspirates are unacceptable for Xpert Xpress SARS-CoV-2/FLU/RSV  testing. Fact Sheet for Patients: https://www.moore.com/ Fact Sheet for Healthcare Providers: https://www.young.biz/ This test is not yet approved or cleared by the [place] FDA and  has been authorized for detection and/or diagnosis of SARS-CoV-2 by  FDA under an Emergency Use Authorization (EUA). This EUA will remain  in effect (meaning this test can be used) for the duration of the  Covid-19 declaration under Section 564(b)(1) of the Act, 21  U.S.C. section 360bbb-3(b)(1), unless the authorization is  terminated or revoked. Performed at [facility], [address] [place], [place]  [address]    Studies/Results: MR ABDOMEN MRCP WO CONTRAST  Result Date: 11/03/2019 CLINICAL DATA:  Right flank pain and jaundice. Twenty-five weeks pregnant. EXAM: MRI ABDOMEN WITHOUT CONTRAST  (INCLUDING MRCP) TECHNIQUE: Multiplanar multisequence MR imaging of the abdomen was performed. Heavily T2-weighted images of the biliary and pancreatic ducts were obtained, and three-dimensional MRCP images were rendered by post processing. COMPARISON:  Abdominal ultrasound 11/03/2019 FINDINGS: Lower chest: No acute findings. Hepatobiliary: No mass or other parenchymal abnormality identified. No cholelithiasis. No biliary dilatation. Pancreas: No mass, inflammatory changes, or other parenchymal abnormality identified. Spleen:  Within normal limits in size and appearance. Adrenals/Urinary Tract: Normal adrenal glands. There is moderate right hydroureteronephrosis. Stomach/Bowel: Stomach is distended with heterogeneous contents. Vascular/Lymphatic: No pathologically enlarged lymph nodes identified. No abdominal aortic aneurysm demonstrated. Other:  None. Musculoskeletal: No suspicious bone lesions identified. IMPRESSION: 1. No acute  findings within the abdomen. 2. No cholelithiasis, choledocholithiasis or biliary dilatation. 3. Moderate right hydroureteronephrosis, commonly seen in pregnancy. Electronically Signed   By: Deatra Robinson M.D.   On: 11/03/2019 00:31   MR 3D Recon At Scanner  Result Date: 11/03/2019 CLINICAL DATA:  Right flank pain and jaundice. Twenty-five weeks pregnant. EXAM: MRI ABDOMEN WITHOUT CONTRAST  (INCLUDING MRCP) TECHNIQUE: Multiplanar multisequence MR imaging of the abdomen was performed. Heavily T2-weighted images of the biliary and pancreatic ducts were obtained, and three-dimensional MRCP images were rendered by post processing. COMPARISON:  Abdominal ultrasound 11/03/2019 FINDINGS: Lower chest: No acute findings. Hepatobiliary: No mass or other parenchymal abnormality identified. No cholelithiasis. No biliary dilatation. Pancreas: No mass, inflammatory changes, or other parenchymal abnormality identified. Spleen:  Within normal limits in size and appearance. Adrenals/Urinary Tract: Normal adrenal glands. There is moderate right hydroureteronephrosis. Stomach/Bowel: Stomach is distended with heterogeneous contents. Vascular/Lymphatic: No pathologically enlarged lymph nodes identified. No abdominal aortic aneurysm demonstrated. Other:  None. Musculoskeletal: No suspicious bone lesions identified. IMPRESSION: 1. No acute findings within the abdomen. 2. No cholelithiasis, choledocholithiasis or biliary dilatation. 3. Moderate right hydroureteronephrosis, commonly seen in pregnancy. Electronically Signed   By: Deatra Robinson M.D.   On: 11/03/2019 00:31   Medications:  I have reviewed the patient's current medications. Prior to Admission:  Medications Prior to Admission  Medication Sig Dispense Refill Last Dose  . Methadone HCl (DOLOPHINE PO) Take 65 mg by mouth daily.   10/29/2019  . Prenatal Vit-Fe Fumarate-FA (PNV PRENATAL PLUS MULTIVITAMIN) 27-1 MG TABS Take by mouth.   10/29/2019  . Prenatal Vit-Fe Fumarate-FA  (PRENATAL MULTIVITAMIN) TABS tablet Take 1 tablet by mouth daily at 12 noon.   Not Taking at Unknown time   Scheduled: . busPIRone  10 mg Oral BID  . ferrous sulfate  325 mg Oral Q breakfast  . gabapentin  300 mg Oral BID  . methadone  32.5 mg Oral Daily  . nicotine  21 mg Transdermal Daily  . potassium chloride  20 mEq Oral Q4H  . prenatal multivitamin  1 tablet Oral Q1200  . tamsulosin  0.4 mg Oral Daily   Continuous: . sodium chloride 1,000 mL (11/03/19 1300)  . cefTRIAXone (ROCEPHIN)  IV 1 g (11/03/19 1447)   YQM:[id number], morphine injection, ondansetron (ZOFRAN) IV, oxyCODONE Anti-infectives (From admission, onward)   Start     Dose/Rate Route Frequency Ordered Stop   11/01/19 1400  cefTRIAXone (ROCEPHIN) 1 g in sodium chloride 0.9 % 100 mL IVPB     1 g 200 mL/hr over 30 Minutes Intravenous Every 24 hours 11/01/19 1340     11/01/19 1330  cefTRIAXone (ROCEPHIN) 1 g in sodium chloride 0.9 % 100 mL IVPB  Status:  Discontinued     1 g 200 mL/hr over 30 Minutes Intravenous  Once 11/01/19 1314 11/01/19 1516     Scheduled Meds: . busPIRone  10 mg Oral BID  . ferrous sulfate  325 mg Oral Q breakfast  . gabapentin  300 mg Oral BID  . methadone  32.5 mg Oral Daily  . nicotine  21 mg Transdermal Daily  . potassium chloride  20 mEq Oral Q4H  . prenatal multivitamin  1 tablet Oral Q1200  . tamsulosin  0.4 mg Oral Daily   Continuous Infusions: . sodium chloride 1,000 mL (11/03/19 1300)  . cefTRIAXone (ROCEPHIN)  IV 1 g (11/03/19 1447)   PRN Meds:.cyclobenzaprine, morphine injection, ondansetron (ZOFRAN) IV, oxyCODONE   Assessment: Principal Problem:   UTI (urinary tract infection) Active Problems:   Low back pain during pregnancy   Abnormal LFTs   IVDU (intravenous drug user)   Hypokalemia   Right flank pain   Hydronephrosis   Normocytic anemia   Pregnancy   Plan: Elevated LFTs, acute hepatitis, without evidence of acute liver failure LFTs are improving,  ultrasound did not reveal any evidence of biliary obstruction MRCP revealed no evidence of choledocholithiasis, cholelithiasisOr biliary dilatation. There is evidence of moderate hydronephrosis No evidence of Tylenol toxicity, U tox positive for amphetamines only  Viral hepatitis labs reveale hepatitis A IgM antibodies positive, hep C antibody positive which is likely the etiology of acute hepatitis HCVRNA pending Rest of the work-up negative so far Monitor LFTs closely, expect the liver enzymes to gradually improve Avoid hepatotoxic agents No indication for antibiotics at this time Patient can be discharged home tomorrow if LFTs continue to improve She should follow-up with GI as outpatient   LOS: 0 days   Glenda Schwartz 11/03/2019, 2:54 PM

## 2019-11-04 ENCOUNTER — Encounter: Payer: Self-pay | Admitting: Gastroenterology

## 2019-11-04 LAB — HSV DNA BY PCR (REFERENCE LAB)
HSV 1 DNA: NEGATIVE
HSV 2 DNA: NEGATIVE

## 2019-11-04 LAB — HEPATITIS C VRS RNA DETECT BY PCR-QUAL: Hepatitis C Vrs RNA by PCR-Qual: POSITIVE — AB

## 2019-11-04 LAB — VARICELLA-ZOSTER BY PCR: Varicella-Zoster, PCR: NEGATIVE

## 2019-11-04 NOTE — Discharge Summary (Signed)
Pt left against medical advice. IV removed prior to leaving. We did encourage her to seek care at [facility] for management. She had no obstetrical complaints while in- house.  Please see progress note by Margaretmary Eddy on 11/03/19 for course of care.

## 2019-11-05 LAB — CMV DNA, QUANTITATIVE, PCR
CMV DNA Quant: NEGATIVE IU/mL
Log10 CMV Qn DNA Pl: UNDETERMINED log10 IU/mL

## 2019-11-06 LAB — IMMUNOGLOBULINS A/E/G/M, SERUM
IgA: 120 mg/dL (ref 87–352)
IgE (Immunoglobulin E), Serum: 169 IU/mL (ref 6–495)
IgG (Immunoglobin G), Serum: 991 mg/dL (ref 586–1602)
IgM (Immunoglobulin M), Srm: 495 mg/dL — ABNORMAL HIGH (ref 26–217)

## 2019-11-06 LAB — CULTURE, BLOOD (ROUTINE X 2)
Culture: NO GROWTH
Culture: NO GROWTH
Special Requests: ADEQUATE
Special Requests: ADEQUATE

## 2019-11-06 LAB — EPSTEIN BARR VRS(EBV DNA BY PCR)
EBV DNA QN by PCR: NEGATIVE copies/mL
log10 EBV DNA Qn PCR: UNDETERMINED log10 copy/mL

## 2019-11-06 LAB — ALPHA-1 ANTITRYPSIN PHENOTYPE: A-1 Antitrypsin, Ser: 225 mg/dL — ABNORMAL HIGH (ref 100–188)

## 2019-11-11 ENCOUNTER — Ambulatory Visit
Admit: 2019-11-11 | Discharge: 2019-11-18 | Disposition: A | Discharge status: Home / Self Care | Payer: MEDICAID | Admitting: Obstetrics & Gynecology

## 2019-11-18 MED ORDER — MIRTAZAPINE 15 MG TABLET
ORAL_TABLET | Freq: Every evening | ORAL | 1 refills | 30 days | Status: CP
Start: 2019-11-18 — End: 2020-01-17
  Filled 2019-11-18: qty 30, 30d supply, fill #0

## 2019-11-18 MED ORDER — FAMOTIDINE 20 MG TABLET
ORAL_TABLET | Freq: Two times a day (BID) | ORAL | 0 refills | 30 days | Status: CP
Start: 2019-11-18 — End: 2019-12-18
  Filled 2019-11-18: qty 60, 30d supply, fill #0

## 2019-11-18 MED ORDER — ACETAMINOPHEN 325 MG TABLET
ORAL_TABLET | Freq: Four times a day (QID) | ORAL | 0 refills | 4 days | Status: CP | PRN
Start: 2019-11-18 — End: ?
  Filled 2019-11-18: qty 30, 4d supply, fill #0

## 2019-11-18 MED ORDER — ONDANSETRON HCL 4 MG TABLET
ORAL_TABLET | Freq: Three times a day (TID) | ORAL | 0 refills | 10.00000 days | Status: CP | PRN
Start: 2019-11-18 — End: 2019-11-28
  Filled 2019-11-18: qty 30, 10d supply, fill #0

## 2019-11-18 MED ORDER — CEPHALEXIN 500 MG CAPSULE
ORAL_CAPSULE | Freq: Four times a day (QID) | ORAL | 0 refills | 1 days | Status: CP
Start: 2019-11-18 — End: 2019-11-19
  Filled 2019-11-18: qty 4, 1d supply, fill #0

## 2019-11-18 MED ORDER — LOPERAMIDE 2 MG CAPSULE
ORAL_CAPSULE | Freq: Four times a day (QID) | ORAL | 0 refills | 8.00000 days | Status: CP | PRN
Start: 2019-11-18 — End: 2019-12-18
  Filled 2019-11-18: qty 30, 7d supply, fill #0

## 2019-11-18 MED ORDER — GABAPENTIN 300 MG CAPSULE
ORAL_CAPSULE | Freq: Three times a day (TID) | ORAL | 0 refills | 30 days | Status: CP | PRN
Start: 2019-11-18 — End: 2019-12-18
  Filled 2019-11-18: qty 90, 30d supply, fill #0

## 2019-11-18 MED ORDER — NARCAN 4 MG/ACTUATION NASAL SPRAY
0 refills | 0 days | Status: CP
Start: 2019-11-18 — End: ?
  Filled 2019-11-18: qty 2, 1d supply, fill #0

## 2019-11-18 MED ORDER — NICOTINE (POLACRILEX) 4 MG GUM
BUCCAL | 0 refills | 5 days | Status: CP | PRN
Start: 2019-11-18 — End: 2019-12-18
  Filled 2019-11-18: qty 28, 28d supply, fill #0
  Filled 2019-11-18: qty 110, 5d supply, fill #0

## 2019-11-18 MED FILL — GABAPENTIN 300 MG CAPSULE: 30 days supply | Qty: 90 | Fill #0 | Status: AC

## 2019-11-18 MED FILL — NICOTINE 14 MG/24 HR DAILY TRANSDERMAL PATCH: 28 days supply | Qty: 28 | Fill #0 | Status: AC

## 2019-11-18 MED FILL — FAMOTIDINE 20 MG TABLET: 30 days supply | Qty: 60 | Fill #0 | Status: AC

## 2019-11-18 MED FILL — NICOTINE (POLACRILEX) 4 MG GUM: 5 days supply | Qty: 110 | Fill #0 | Status: AC

## 2019-11-18 MED FILL — MIRTAZAPINE 15 MG TABLET: 30 days supply | Qty: 30 | Fill #0 | Status: AC

## 2019-11-18 MED FILL — ONDANSETRON HCL 4 MG TABLET: 10 days supply | Qty: 30 | Fill #0 | Status: AC

## 2019-11-18 MED FILL — CEPHALEXIN 500 MG CAPSULE: 1 days supply | Qty: 4 | Fill #0 | Status: AC

## 2019-11-18 MED FILL — NICOTINE 21 MG/24 HR DAILY TRANSDERMAL PATCH: 28 days supply | Qty: 28 | Fill #0 | Status: AC

## 2019-11-18 MED FILL — NARCAN 4 MG/ACTUATION NASAL SPRAY: 1 days supply | Qty: 2 | Fill #0 | Status: AC

## 2019-11-18 MED FILL — ACETAMINOPHEN 325 MG TABLET: 4 days supply | Qty: 30 | Fill #0 | Status: AC

## 2019-11-18 MED FILL — LOPERAMIDE 2 MG CAPSULE: 7 days supply | Qty: 30 | Fill #0 | Status: AC

## 2019-11-19 MED ORDER — NICOTINE POLACRILEX 4 MG MT GUM
4.00 | CHEWING_GUM | OROMUCOSAL | Status: DC
Start: ? — End: 2019-11-19

## 2019-11-19 MED ORDER — MAGNESIUM OXIDE 400 MG PO TABS
400.00 | ORAL_TABLET | ORAL | Status: DC
Start: 2019-11-19 — End: 2019-11-19

## 2019-11-19 MED ORDER — GENERIC EXTERNAL MEDICATION
10.00 | Status: DC
Start: ? — End: 2019-11-19

## 2019-11-19 MED ORDER — CEPHALEXIN 250 MG PO CAPS
500.00 | ORAL_CAPSULE | ORAL | Status: DC
Start: 2019-11-18 — End: 2019-11-19

## 2019-11-19 MED ORDER — ACETAMINOPHEN 325 MG PO TABS
650.00 | ORAL_TABLET | ORAL | Status: DC
Start: ? — End: 2019-11-19

## 2019-11-19 MED ORDER — ONDANSETRON HCL 8 MG PO TABS
4.00 | ORAL_TABLET | ORAL | Status: DC
Start: ? — End: 2019-11-19

## 2019-11-19 MED ORDER — GENERIC EXTERNAL MEDICATION
85.00 | Status: DC
Start: 2019-11-19 — End: 2019-11-19

## 2019-11-19 MED ORDER — MIRTAZAPINE 15 MG PO TABS
15.00 | ORAL_TABLET | ORAL | Status: DC
Start: 2019-11-18 — End: 2019-11-19

## 2019-11-19 MED ORDER — LOPERAMIDE HCL 2 MG PO CAPS
2.00 | ORAL_CAPSULE | ORAL | Status: DC
Start: ? — End: 2019-11-19

## 2019-11-19 MED ORDER — NICOTINE 21 MG/24HR TD PT24
1.00 | MEDICATED_PATCH | TRANSDERMAL | Status: DC
Start: 2019-11-19 — End: 2019-11-19

## 2019-11-19 MED ORDER — FAMOTIDINE 20 MG PO TABS
20.00 | ORAL_TABLET | ORAL | Status: DC
Start: 2019-11-18 — End: 2019-11-19

## 2019-11-19 MED ORDER — PNV PRENATAL PLUS MULTIVITAMIN 27-1 MG PO TABS
1.00 | ORAL_TABLET | ORAL | Status: DC
Start: 2019-11-19 — End: 2019-11-19

## 2019-11-19 MED ORDER — PROMETHAZINE HCL 12.5 MG PO TABS
12.50 | ORAL_TABLET | ORAL | Status: DC
Start: ? — End: 2019-11-19

## 2019-11-19 MED ORDER — GABAPENTIN 300 MG PO CAPS
300.00 | ORAL_CAPSULE | ORAL | Status: DC
Start: ? — End: 2019-11-19

## 2019-11-19 MED ORDER — NICOTINE 14 MG/24HR TD PT24
1.00 | MEDICATED_PATCH | TRANSDERMAL | Status: DC
Start: 2019-11-19 — End: 2019-11-19

## 2019-11-19 MED ORDER — NICOTINE 21 MG/24 HR DAILY TRANSDERMAL PATCH
MEDICATED_PATCH | Freq: Every day | TRANSDERMAL | 0 refills | 28 days | Status: CP
Start: 2019-11-19 — End: ?
  Filled 2019-11-18: qty 28, 28d supply, fill #0

## 2019-11-19 MED ORDER — NICOTINE 14 MG/24 HR DAILY TRANSDERMAL PATCH
MEDICATED_PATCH | Freq: Every day | TRANSDERMAL | 0 refills | 28.00000 days | Status: CP
Start: 2019-11-19 — End: ?

## 2019-11-20 LAB — MISC LABCORP TEST (SEND OUT): Labcorp test code: 9985

## 2019-12-03 ENCOUNTER — Other Ambulatory Visit: Admit: 2019-12-03 | Discharge: 2019-12-04 | Payer: MEDICAID | Attending: Family | Primary: Family

## 2019-12-03 DIAGNOSIS — F1120 Opioid dependence, uncomplicated: Principal | ICD-10-CM

## 2019-12-03 DIAGNOSIS — Z3493 Encounter for supervision of normal pregnancy, unspecified, third trimester: Principal | ICD-10-CM

## 2019-12-04 ENCOUNTER — Ambulatory Visit: Admit: 2019-12-04 | Discharge: 2019-12-05 | Payer: MEDICAID

## 2019-12-10 ENCOUNTER — Ambulatory Visit
Admit: 2019-12-10 | Discharge: 2019-12-17 | Disposition: A | Discharge status: Rehabilitation Facility | Payer: MEDICAID | Admitting: Maternal & Fetal Medicine

## 2019-12-16 DIAGNOSIS — F1120 Opioid dependence, uncomplicated: Principal | ICD-10-CM

## 2019-12-16 MED ORDER — NICOTINE 14 MG/24 HR DAILY TRANSDERMAL PATCH
MEDICATED_PATCH | Freq: Every day | TRANSDERMAL | 5 refills | 28.00000 days | Status: CP
Start: 2019-12-16 — End: ?
  Filled 2019-12-17: qty 28, 28d supply, fill #0

## 2019-12-16 MED ORDER — DIPHENHYDRAMINE 25 MG CAPSULE
ORAL_CAPSULE | Freq: Four times a day (QID) | ORAL | 0 refills | 8 days | Status: CP | PRN
Start: 2019-12-16 — End: ?
  Filled 2019-12-17: qty 60, 7d supply, fill #0

## 2019-12-16 MED ORDER — CLONIDINE HCL 0.1 MG TABLET
ORAL_TABLET | Freq: Four times a day (QID) | ORAL | 0 refills | 8.00000 days | Status: CP | PRN
Start: 2019-12-16 — End: 2020-01-15
  Filled 2019-12-17: qty 30, 7d supply, fill #0

## 2019-12-16 MED ORDER — MIRTAZAPINE 15 MG TABLET
ORAL_TABLET | Freq: Every evening | ORAL | 0 refills | 30 days | Status: CP
Start: 2019-12-16 — End: 2020-01-15
  Filled 2019-12-17: qty 30, 30d supply, fill #0

## 2019-12-16 MED ORDER — MELATONIN 3 MG TABLET
ORAL_TABLET | Freq: Every evening | ORAL | 3 refills | 60 days | Status: CP
Start: 2019-12-16 — End: ?
  Filled 2019-12-17: qty 60, 60d supply, fill #0

## 2019-12-17 MED ORDER — DICYCLOMINE 10 MG CAPSULE
ORAL_CAPSULE | Freq: Four times a day (QID) | ORAL | 0 refills | 4.00000 days | Status: CP | PRN
Start: 2019-12-17 — End: 2020-01-16
  Filled 2019-12-17: qty 30, 3d supply, fill #0

## 2019-12-17 MED ORDER — BUPRENORPHINE 8 MG-NALOXONE 2 MG SUBLINGUAL FILM
ORAL_FILM | Freq: Three times a day (TID) | SUBLINGUAL | 0 refills | 14 days | Status: CP
Start: 2019-12-17 — End: ?
  Filled 2019-12-17: qty 42, 14d supply, fill #0

## 2019-12-17 MED ORDER — GABAPENTIN 400 MG CAPSULE
ORAL_CAPSULE | Freq: Four times a day (QID) | ORAL | 0 refills | 30 days | Status: CP
Start: 2019-12-17 — End: 2020-01-16
  Filled 2019-12-17: qty 120, 30d supply, fill #0

## 2019-12-17 MED ORDER — ONDANSETRON 8 MG DISINTEGRATING TABLET
ORAL_TABLET | Freq: Three times a day (TID) | ORAL | 0 refills | 20 days | Status: CP | PRN
Start: 2019-12-17 — End: 2020-01-16

## 2019-12-17 MED FILL — MELATONIN 3 MG TABLET: 60 days supply | Qty: 60 | Fill #0 | Status: AC

## 2019-12-17 MED FILL — CLONIDINE HCL 0.1 MG TABLET: 7 days supply | Qty: 30 | Fill #0 | Status: AC

## 2019-12-17 MED FILL — MIRTAZAPINE 15 MG TABLET: 30 days supply | Qty: 30 | Fill #0 | Status: AC

## 2019-12-17 MED FILL — DICYCLOMINE 10 MG CAPSULE: 3 days supply | Qty: 30 | Fill #0 | Status: AC

## 2019-12-17 MED FILL — GABAPENTIN 400 MG CAPSULE: 30 days supply | Qty: 120 | Fill #0 | Status: AC

## 2019-12-17 MED FILL — BANOPHEN 25 MG CAPSULE: 7 days supply | Qty: 60 | Fill #0 | Status: AC

## 2019-12-17 MED FILL — NICOTINE 14 MG/24 HR DAILY TRANSDERMAL PATCH: 28 days supply | Qty: 28 | Fill #0 | Status: AC

## 2019-12-17 MED FILL — SUBOXONE 8 MG-2 MG SUBLINGUAL FILM: 14 days supply | Qty: 42 | Fill #0 | Status: AC

## 2019-12-21 ENCOUNTER — Ambulatory Visit: Admit: 2019-12-21 | Discharge: 2019-12-22 | Payer: MEDICAID | Attending: Family | Primary: Family

## 2019-12-21 DIAGNOSIS — F1120 Opioid dependence, uncomplicated: Principal | ICD-10-CM

## 2019-12-21 DIAGNOSIS — O09893 Supervision of other high risk pregnancies, third trimester: Principal | ICD-10-CM

## 2019-12-21 DIAGNOSIS — B159 Hepatitis A without hepatic coma: Principal | ICD-10-CM

## 2019-12-21 DIAGNOSIS — F419 Anxiety disorder, unspecified: Principal | ICD-10-CM

## 2019-12-21 DIAGNOSIS — O0993 Supervision of high risk pregnancy, unspecified, third trimester: Principal | ICD-10-CM

## 2019-12-21 DIAGNOSIS — O99891 Rubella non-immune status, antepartum: Principal | ICD-10-CM

## 2019-12-21 DIAGNOSIS — Z283 Underimmunization status: Principal | ICD-10-CM

## 2019-12-21 DIAGNOSIS — F17200 Nicotine dependence, unspecified, uncomplicated: Principal | ICD-10-CM

## 2019-12-21 DIAGNOSIS — B182 Chronic viral hepatitis C: Principal | ICD-10-CM

## 2019-12-21 MED ORDER — BUPRENORPHINE HCL 8 MG SUBLINGUAL TABLET
ORAL_TABLET | Freq: Three times a day (TID) | SUBLINGUAL | 0 refills | 30 days | Status: CP
Start: 2019-12-21 — End: 2020-01-20

## 2019-12-21 MED ORDER — CALCIUM CARBONATE 200 MG CALCIUM (500 MG) CHEWABLE TABLET
ORAL_TABLET | Freq: Every day | ORAL | 3 refills | 90 days | Status: CP
Start: 2019-12-21 — End: 2020-12-20

## 2019-12-21 MED ORDER — FAMOTIDINE 20 MG TABLET
ORAL_TABLET | Freq: Two times a day (BID) | ORAL | 0 refills | 30.00000 days | Status: CP
Start: 2019-12-21 — End: 2020-01-20

## 2019-12-23 MED ORDER — QUETIAPINE 25 MG TABLET
ORAL_TABLET | Freq: Every evening | ORAL | 0 refills | 30.00000 days | Status: CP
Start: 2019-12-23 — End: 2020-01-22

## 2019-12-25 ENCOUNTER — Ambulatory Visit: Admit: 2019-12-25 | Discharge: 2019-12-25 | Payer: MEDICAID

## 2019-12-25 MED ORDER — MICONAZOLE NITRATE 2 % TOPICAL CREAM
1 refills | 0 days | Status: CP
Start: 2019-12-25 — End: 2020-01-01

## 2019-12-30 MED ORDER — NICOTINE 21 MG/24 HR DAILY TRANSDERMAL PATCH
MEDICATED_PATCH | Freq: Every day | TRANSDERMAL | 0 refills | 28 days | Status: CP
Start: 2019-12-30 — End: ?

## 2020-01-04 ENCOUNTER — Other Ambulatory Visit: Admit: 2020-01-04 | Discharge: 2020-01-05 | Attending: Family | Primary: Family

## 2020-01-04 DIAGNOSIS — O0993 Supervision of high risk pregnancy, unspecified, third trimester: Principal | ICD-10-CM

## 2020-01-04 DIAGNOSIS — B182 Chronic viral hepatitis C: Principal | ICD-10-CM

## 2020-01-04 DIAGNOSIS — B159 Hepatitis A without hepatic coma: Principal | ICD-10-CM

## 2020-01-11 ENCOUNTER — Encounter
Admit: 2020-01-11 | Discharge: 2020-01-15 | Disposition: A | Discharge status: Home / Self Care | Payer: MEDICAID | Attending: Student in an Organized Health Care Education/Training Program | Admitting: Obstetrics & Gynecology

## 2020-01-11 ENCOUNTER — Ambulatory Visit
Admit: 2020-01-11 | Discharge: 2020-01-15 | Disposition: A | Discharge status: Home / Self Care | Payer: MEDICAID | Attending: Family | Admitting: Obstetrics & Gynecology | Primary: Family

## 2020-01-11 ENCOUNTER — Ambulatory Visit
Admit: 2020-01-11 | Discharge: 2020-01-15 | Disposition: A | Discharge status: Home / Self Care | Payer: MEDICAID | Admitting: Obstetrics & Gynecology

## 2020-01-11 DIAGNOSIS — Z283 Underimmunization status: Secondary | ICD-10-CM

## 2020-01-11 DIAGNOSIS — O169 Unspecified maternal hypertension, unspecified trimester: Principal | ICD-10-CM

## 2020-01-11 DIAGNOSIS — O99891 Rubella non-immune status, antepartum: Principal | ICD-10-CM

## 2020-01-11 DIAGNOSIS — O09893 Supervision of other high risk pregnancies, third trimester: Secondary | ICD-10-CM

## 2020-01-11 DIAGNOSIS — F1121 Opioid dependence, in remission: Principal | ICD-10-CM

## 2020-01-11 DIAGNOSIS — F419 Anxiety disorder, unspecified: Principal | ICD-10-CM

## 2020-01-11 DIAGNOSIS — B159 Hepatitis A without hepatic coma: Principal | ICD-10-CM

## 2020-01-11 DIAGNOSIS — F17200 Nicotine dependence, unspecified, uncomplicated: Principal | ICD-10-CM

## 2020-01-11 DIAGNOSIS — O0993 Supervision of high risk pregnancy, unspecified, third trimester: Principal | ICD-10-CM

## 2020-01-11 DIAGNOSIS — B182 Chronic viral hepatitis C: Principal | ICD-10-CM

## 2020-01-15 ENCOUNTER — Ambulatory Visit
Admit: 2020-01-15 | Discharge: 2020-01-16 | Disposition: A | Discharge status: Home / Self Care | Payer: MEDICAID | Admitting: Obstetrics & Gynecology

## 2020-01-15 MED ORDER — ACETAMINOPHEN 325 MG TABLET
ORAL_TABLET | Freq: Four times a day (QID) | ORAL | 2 refills | 5 days | Status: CP
Start: 2020-01-15 — End: ?

## 2020-01-15 MED ORDER — IBUPROFEN 600 MG TABLET
ORAL_TABLET | Freq: Four times a day (QID) | ORAL | 2 refills | 10 days | Status: CP
Start: 2020-01-15 — End: ?

## 2020-01-15 MED ORDER — DOCUSATE SODIUM 100 MG CAPSULE
ORAL_CAPSULE | Freq: Two times a day (BID) | ORAL | 0 refills | 30 days | Status: CP
Start: 2020-01-15 — End: 2020-02-14

## 2020-01-15 MED ORDER — POLYETHYLENE GLYCOL 3350 17 GRAM ORAL POWDER PACKET
PACK | Freq: Every day | ORAL | 0 refills | 30 days | Status: CP
Start: 2020-01-15 — End: 2020-02-14

## 2020-01-16 ENCOUNTER — Ambulatory Visit: Admit: 2020-01-16 | Discharge: 2020-01-16 | Payer: MEDICAID

## 2020-01-16 MED ORDER — MAGNESIUM 200 MG (AS MAGNESIUM OXIDE) CHEWABLE TABLET
ORAL_TABLET | Freq: Every day | ORAL | 3 refills | 0.00000 days | Status: CP | PRN
Start: 2020-01-16 — End: ?

## 2020-01-16 MED ORDER — RIBOFLAVIN (VITAMIN B2) 100 MG TABLET
ORAL_TABLET | Freq: Every day | ORAL | 3 refills | 30 days | Status: CP | PRN
Start: 2020-01-16 — End: ?

## 2020-01-17 MED ORDER — NIFEDIPINE ER 30 MG TABLET,EXTENDED RELEASE 24 HR
ORAL_TABLET | Freq: Every day | ORAL | 1 refills | 30.00000 days | Status: CP
Start: 2020-01-17 — End: 2020-03-17

## 2020-01-18 MED ORDER — PRENATAL VITAMIN TABLET
ORAL_TABLET | Freq: Every day | ORAL | 11 refills | 30.00000 days | Status: CP
Start: 2020-01-18 — End: 2021-01-17

## 2020-01-20 ENCOUNTER — Ambulatory Visit
Admit: 2020-01-20 | Discharge: 2020-01-21 | Disposition: A | Discharge status: Home / Self Care | Payer: MEDICAID | Admitting: Obstetrics & Gynecology

## 2020-01-20 MED ORDER — GABAPENTIN 400 MG CAPSULE
ORAL_CAPSULE | Freq: Four times a day (QID) | ORAL | 0 refills | 30.00000 days | Status: CP
Start: 2020-01-20 — End: 2020-02-19

## 2020-01-21 ENCOUNTER — Ambulatory Visit: Admit: 2020-01-21 | Discharge: 2020-01-22 | Payer: MEDICAID | Attending: Family | Primary: Family

## 2020-01-21 MED ORDER — BUPRENORPHINE HCL 8 MG SUBLINGUAL TABLET
ORAL_TABLET | Freq: Three times a day (TID) | SUBLINGUAL | 0 refills | 30 days | Status: CP
Start: 2020-01-21 — End: 2020-02-20

## 2020-01-22 MED ORDER — QUETIAPINE 25 MG TABLET
ORAL_TABLET | Freq: Every evening | ORAL | 1 refills | 30 days | Status: CP
Start: 2020-01-22 — End: 2020-03-22

## 2020-02-08 MED ORDER — ATOMOXETINE 25 MG CAPSULE
ORAL_CAPSULE | Freq: Every day | ORAL | 0 refills | 30.00000 days | Status: CP
Start: 2020-02-08 — End: 2020-03-09

## 2020-02-15 ENCOUNTER — Other Ambulatory Visit: Admit: 2020-02-15 | Discharge: 2020-02-16 | Payer: MEDICAID | Attending: Family | Primary: Family

## 2020-02-15 MED ORDER — BENZOCAINE 20 % ORAL MUCOSAL LIQUID
1 refills | 0 days | Status: CP
Start: 2020-02-15 — End: ?

## 2020-02-15 MED ORDER — GABAPENTIN 400 MG CAPSULE
ORAL_CAPSULE | Freq: Four times a day (QID) | ORAL | 0 refills | 30 days | Status: CP
Start: 2020-02-15 — End: 2020-03-16

## 2020-02-15 MED ORDER — ATOMOXETINE 25 MG CAPSULE
ORAL_CAPSULE | Freq: Every day | ORAL | 0 refills | 30.00000 days | Status: CP
Start: 2020-02-15 — End: 2020-03-16

## 2020-02-18 DIAGNOSIS — F1120 Opioid dependence, uncomplicated: Principal | ICD-10-CM

## 2020-02-18 MED ORDER — BUPRENORPHINE HCL 8 MG SUBLINGUAL TABLET
ORAL_TABLET | Freq: Three times a day (TID) | SUBLINGUAL | 0 refills | 28.00000 days | Status: CP
Start: 2020-02-18 — End: 2020-03-17

## 2020-03-07 MED ORDER — ATOMOXETINE 60 MG CAPSULE
ORAL_CAPSULE | Freq: Every day | ORAL | 0 refills | 30 days | Status: CP
Start: 2020-03-07 — End: 2020-04-06

## 2020-03-07 MED ORDER — GABAPENTIN 400 MG CAPSULE
ORAL_CAPSULE | Freq: Four times a day (QID) | ORAL | 0 refills | 30 days | Status: CP
Start: 2020-03-07 — End: 2020-04-06

## 2020-03-14 MED ORDER — NIFEDIPINE ER 30 MG TABLET,EXTENDED RELEASE 24 HR
ORAL_TABLET | Freq: Every day | ORAL | 1 refills | 30 days | Status: CP
Start: 2020-03-14 — End: 2020-05-13

## 2020-03-17 DIAGNOSIS — F1120 Opioid dependence, uncomplicated: Principal | ICD-10-CM

## 2020-03-17 MED ORDER — BUPRENORPHINE HCL 8 MG SUBLINGUAL TABLET
ORAL_TABLET | Freq: Three times a day (TID) | SUBLINGUAL | 0 refills | 28 days | Status: CP
Start: 2020-03-17 — End: 2020-04-14

## 2020-03-21 MED ORDER — ATOMOXETINE 60 MG CAPSULE
ORAL_CAPSULE | Freq: Every day | ORAL | 0 refills | 30.00000 days | Status: CP
Start: 2020-03-21 — End: 2020-04-20

## 2020-03-21 MED ORDER — ESCITALOPRAM 10 MG TABLET
ORAL_TABLET | 0 refills | 0 days | Status: CP
Start: 2020-03-21 — End: ?

## 2020-03-21 MED ORDER — GABAPENTIN 400 MG CAPSULE
ORAL_CAPSULE | Freq: Four times a day (QID) | ORAL | 0 refills | 30.00000 days | Status: CP
Start: 2020-03-21 — End: 2020-04-20

## 2020-03-31 ENCOUNTER — Ambulatory Visit: Admit: 2020-03-31 | Discharge: 2020-04-01 | Payer: MEDICAID | Attending: Gastroenterology | Primary: Gastroenterology

## 2020-03-31 DIAGNOSIS — B159 Hepatitis A without hepatic coma: Principal | ICD-10-CM

## 2020-03-31 DIAGNOSIS — B182 Chronic viral hepatitis C: Principal | ICD-10-CM

## 2020-04-04 MED ORDER — ESCITALOPRAM 10 MG TABLET
ORAL_TABLET | Freq: Every day | ORAL | 0 refills | 30.00000 days | Status: CP
Start: 2020-04-04 — End: 2020-05-04

## 2020-04-05 DIAGNOSIS — B182 Chronic viral hepatitis C: Principal | ICD-10-CM

## 2020-04-05 MED ORDER — GLECAPREVIR 100 MG-PIBRENTASVIR 40 MG TABLET
Freq: Every day | ORAL | 1 refills | 28.00000 days | Status: CP
Start: 2020-04-05 — End: ?
  Filled 2020-04-12: qty 84, 28d supply, fill #0

## 2020-04-08 MED ORDER — PRENATAL VITAMIN TABLET
ORAL_TABLET | Freq: Every day | ORAL | 11 refills | 30.00000 days | Status: CP
Start: 2020-04-08 — End: 2021-04-08

## 2020-04-11 NOTE — Unmapped (Signed)
[facility]   Patient Onboarding/Medication Counseling    Ms.Ebony Chandler is a 24 y.o. female with Hepatitits C who I am counseling today on initiation of therapy.  I am speaking to the patient.    Was a Nurse, learning disability used for this call? No    Verified patient's date of birth / HIPAA.    Specialty medication(s) to be sent: Infectious Disease: Mavyret      Non-specialty medications/supplies to be sent: n/a      Medications not needed at this time: n/a         Mavyret (glecaprevir and pibrentasvir) 100mg /40mg     The patient declined counseling on medication administration, missed dose instructions, goals of therapy, side effects and monitoring parameters, warnings and precautions, drug/food interactions and storage, handling precautions, and disposal because they were counseled in clinic. The information in the declined sections below are for informational purposes only and was not discussed with patient.       Planned Start Date: 04/13/20    Has the patient been told to call and make a 4 week follow-up appointment after their medicine has been received? Yes.  She was told to call [phone] option 1, option 1    Medication & Administration     Dosage: Take three tablets by mouth once daily for 8 weeks.    Administration: Take with food.    Adherence/Missed dose instructions:   ??? Take missed dose with food as soon as you think about it.   ??? If it has been more than 18 hours since the missed dose, skip the missed dose and resume with your next scheduled dose.  ??? Do not take extra doses or 2 doses at the same time.    Goals of Therapy     The goal of therapy is to cure the patient of Hepatitis C. Patients who have undetectable HCV RNA in the serum, as assessed by a sensitive polymerase chain reaction (PCR) assay, ?12 weeks after treatment completion are deemed to have achieved SVR (cure). (www.hcvguidelines.org)     Side Effects & Monitoring Parameters     Common Side Effects:  ??? Headache  ??? Fatigue ??? Nausea    The following side effects should be reported to the provider:  ??? Signs of liver problems like dark urine, feeling tired, lack of hunger, upset stomach or stomach pain, light-colored stools, throwing up, or yellow skin/eyes.  ??? Signs of an allergic reaction, such as rash; hives; itching; red, swollen, blistered, or peeling skin with or without fever.   ??? If you have wheezing; tightness in the chest or throat; trouble breathing, swallowing, or talking; unusual hoarseness; or swelling of the mouth, face, lips, tongue, or throat, call 911 or go to the closest emergency department (ED).     Monitoring Parameters: (AASLD-IDSA Guidelines)    Within 6 months prior to starting treatment:  ??? Complete blood count (CBC).  ??? International normalized ratio (INR) if clinically indicated.   ??? Hepatic function panel: albumin, total and direct bilirubin, ALT, AST, and Alkaline Phosphatase levels.  ??? Calculated glomerular filtration rate (eGFR).  ??? Pregnancy testing within 1 month prior to starting treatment in females of childbearing age  Any time prior to starting treatment:  ??? Test for HCV genotype.  ??? Assess for hepatic fibrosis.  ??? Quantitative HCV RNA (HCV viral load).  ??? Assess for active HBV coinfection: HBV surface antigen (HBsAg); If HBsAg positive, then should be assessed for whether HBV DNA level meets AASLD  criteria for HBV treatment.  ??? Assess for evidence of prior HBV infection and immunity: HBV core antibody (anti-HBc) and HBV surface antibody (anti-HBS).   ??? Assess for HIV coinfection.    Monitoring during treatment:   ??? Diabetics should monitor for signs of hypoglycemia.  ??? Patients on warfarin should monitor for changes in INR.  ??? Pregnancy test monthly in females of childbearing age.  ??? For HBsAg positive patients not already on HBV therapy because baseline HBV DNA level does not meet treatment criteria:   o Initiate prophylactic HBV antiviral therapy OR  o Monitor HBV DNA levels monthly during and immediately after Mavyret therapy.    After treatment to document a cure:   ??? Quantitative HCV viral load 12 weeks after completion of therapy.    Contraindications, Warnings, & Precautions     Contraindications: Moderate or severe hepatic impairment (Child-Pugh class B or C); history of hepatic decompensation; coadministration with atazanavir or rifampin.    Disease-related concerns:   o Hepatitis B virus reactivation.   o Risk of hepatic decompensation/failure in patients with evidence of advanced liver disease.    Drug/Food Interactions     ??? Medication list reviewed in Epic. The patient was instructed to inform the care team before taking any new medications or supplements. No drug interactions identified.   ??? Encourage minimizing alcohol intake.  ??? HMG-CoA Reductase Inhibitors: Use the lowest dose of statins possible. Monitor for signs of toxicity such as myopathy. Ask patient to self-report potential side effects of increased concentrations such as muscle pain.  o Avoid use with atorvastatin, simvastatin, or lovastatin.  o Limit rosuvastatin to a maximum of 10mg  daily.  o Reduce pravastatin dose by 50%.   ??? Potential interactions: Clearance of HCV infection with direct acting antivirals may lead to changes in hepatic function, which may impact the safe and effective use of concomitant medications. Frequent monitoring of relevant laboratory parameters (e.g., International Normalized Ratio [INR] in patients taking warfarin, blood glucose levels in diabetic patients) or drug concentrations of concomitant medications such as cytochrome P450 substrates with a narrow therapeutic index (e.g. certain immunosuppressants) is recommended to ensure safe and effective use. Dose adjustments of concomitant medications may be necessary.    Storage, Handling Precautions, & Disposal     ??? Store in the original container at room temperature.   ??? Store in a dry place. Do not store in a bathroom.   ??? Keep all drugs in a safe place. Keep all drugs out of the reach of children and pets.   ??? Disposal: You should NOT have any Mavyret to throw away.      Current Medications (including OTC/herbals), Comorbidities and Allergies     Current Outpatient Medications   Medication Sig Dispense Refill   ??? acetaminophen (TYLENOL) 325 MG tablet Take 2 tablets (650 mg total) by mouth Every six (6) hours. 40 tablet 2   ??? atomoxetine (STRATTERA) 60 MG capsule Take 1 capsule (60 mg total) by mouth daily with lunch. 30 capsule 0   ??? benzocaine 20 % Liqd Apply to affected area 4 times a day as needed 30 mL 1   ??? buprenorphine HCL (SUBUTEX) 8 mg tablet Place 1 tablet (8 mg total) under the tongue Three (3) times a day for 28 days. 84 tablet 0   ??? escitalopram oxalate (LEXAPRO) 10 MG tablet Take 1.5 tablets (15 mg total) by mouth daily. 45 tablet 0   ??? gabapentin (NEURONTIN) 400 MG capsule Take 1 capsule (  400 mg total) by mouth Four (4) times a day. 120 capsule 0   ??? glecaprevir-pibrentasvir (MAVYRET) 100-40 mg tablet Take 3 tablets by mouth daily with food. 84 tablet 1   ??? ibuprofen (MOTRIN) 600 MG tablet Take 1 tablet (600 mg total) by mouth Every six (6) hours. 40 tablet 2   ??? magnesium oxide 200 mg magnesium Chew Chew 400 mg daily as needed (headaches). 30 tablet 3   ??? naloxone (NARCAN) 4 mg/actuation nasal spray One spray in either nostril once for known/suspected opioid overdose. May repeat every 2-3 minutes in alternating nostril til EMS arrives 2 each 0   ??? nicotine (NICODERM CQ) 21 mg/24 hr patch Place 1 patch on the skin daily. 28 patch 0   ??? NIFEdipine (PROCARDIA XL) 30 MG 24 hr tablet Take 1 tablet (30 mg total) by mouth daily. 30 tablet 1   ??? prenat.vits,cal,min-iron-folic (PRENATAL VITAMIN) Tab tablet Take 1 tablet by mouth daily. 30 tablet 11   ??? riboflavin, vitamin B2, 100 mg Tab Take 1 tablet (100 mg total) by mouth daily as needed (headaches). 30 tablet 3     No current facility-administered medications for this visit.       No Known Allergies    Patient Active Problem List   Diagnosis   ??? Seasonal allergic rhinitis   ??? Tobacco use disorder   ??? Opioid use disorder, severe, dependence (CMS-HCC)   ??? Anxiety   ??? Amphetamine use disorder, mild (CMS-HCC)   ??? Cellulitis   ??? Hepatitis C   ??? Hepatitis A   ??? Third trimester pregnancy   ??? Rubella non-immune status, antepartum   ??? Susceptible to Varicella (non-immune), currently pregnant in third trimester       Reviewed and up to date in Epic.    Appropriateness of Therapy     Is medication and dose appropriate based on diagnosis? Yes    Prescription has been clinically reviewed: Yes    Baseline Quality of Life Assessment      How many days over the past month did your Hepatitis C  keep you from your normal activities? For example, brushing your teeth or getting up in the morning. 0    Financial Information     Medication Assistance provided: Prior Authorization    Anticipated copay of $3.00 reviewed with patient. Verified delivery address.    Delivery Information     Scheduled delivery date: 04/12/20    Expected start date: 04/13/20    Medication will be delivered via Same Day Courier to the prescription address in [facility].  This shipment will not require a signature.      Explained the services we provide at [facility] and that each month we would call to set up refills.  Stressed importance of returning phone calls so that we could ensure they receive their medications in time each month.  Informed patient that we should be setting up refills 7-10 days prior to when they will run out of medication.  A pharmacist will reach out to perform a clinical assessment periodically.  Informed patient that a welcome packet and a drug information handout will be sent.      Patient verbalized understanding of the above information as well as how to contact the pharmacy at [phone] option 4 with any questions/concerns.  The pharmacy is open Monday through Friday 8:30am-4:30pm.  A pharmacist is available 24/7 via pager to answer any clinical questions they may have.  Patient Specific Needs     - Does the patient have any physical, cognitive, or cultural barriers? No    - Patient prefers to have medications discussed with  Patient     - Is the patient or caregiver able to read and understand education materials at a high school level or above? Yes    - Patient's primary language is  English     - Is the patient high risk? No    - Does the patient require a Care Management Plan? No     - Does the patient require physician intervention or other additional services (i.e. nutrition, smoking cessation, social work)? No      Roderic Palau  [facility] Specialty Pharmacist

## 2020-04-11 NOTE — Unmapped (Signed)
[facility] Specialty Medication Onboarding    Specialty Medication: Mavyret 100-40 mg tablet   Prior Authorization: Approved   Financial Assistance: No - copay  <$25  Final Copay/Day Supply: $3.00 / 28 day supply    Insurance Restrictions: None     Notes to Pharmacist:     The triage team has completed the benefits investigation and has determined that the patient is able to fill this medication at [facility]. Please contact the patient to complete the onboarding or follow up with the prescribing physician as needed.

## 2020-04-12 MED FILL — MAVYRET 100 MG-40 MG TABLET: 28 days supply | Qty: 84 | Fill #0 | Status: AC

## 2020-04-14 DIAGNOSIS — F1120 Opioid dependence, uncomplicated: Principal | ICD-10-CM

## 2020-04-14 MED ORDER — BUPRENORPHINE HCL 8 MG SUBLINGUAL TABLET
ORAL_TABLET | Freq: Three times a day (TID) | SUBLINGUAL | 0 refills | 28.00000 days | Status: CP
Start: 2020-04-14 — End: 2020-05-12

## 2020-04-15 NOTE — Unmapped (Signed)
Orders Only:  04/14/20   Liddie Chichester   DOB 1996/05/02   MRN [id number]     Pt continues [facility] [place] [facility].    UDS monitored by [employer].     --CONTINUE Subutex (buprenorphine) 8 mg tablets: one tablet sL tid --  Today a prescription for a 28 day supply (# 84 tabs ) with no refills has been sent to [facility].         Zelphia Cairo, MD

## 2020-04-18 DIAGNOSIS — F32A Depression, unspecified depression type: Principal | ICD-10-CM

## 2020-04-18 MED ORDER — GABAPENTIN 400 MG CAPSULE
ORAL_CAPSULE | Freq: Four times a day (QID) | ORAL | 1 refills | 30 days | Status: CP
Start: 2020-04-18 — End: 2020-06-17

## 2020-04-18 MED ORDER — ATOMOXETINE 60 MG CAPSULE
ORAL_CAPSULE | Freq: Every day | ORAL | 1 refills | 30 days | Status: CP
Start: 2020-04-18 — End: 2020-06-17

## 2020-04-18 NOTE — Unmapped (Signed)
[facility]  Psychiatry   Established Patient E&M Service - Outpatient       Assessment:    Ebony Chandler presents for follow-up evaluation. Patient's symptoms/clinical picture was largely unchanged; she continues to report irritability, poor appetite, and poor motivation, although she does report some improvement in focus and energy that she attributes to Strattera. On shared decision making, patient opted to increase Lexapro to address depressive symptoms. Patient has been taking highest dose of gabapentin in the morning; patient counseled that this may lead to sedation/fatigue, but patient feels this is most effective for her anxiety.     Identifying Information:  Ebony Chandler is a 24 y.o. female with a history of with a history of generalized anxiety disorder, ADHD, and an unspecified mood disorder (possibly depression) who presents for evaluation of lack of motivation, difficulty concentrating and irritable mood.   ??  She moved into [facility] campus on 12/17/19. She has one son, Ebony Chandler, who was born 01/12/2020.   ??  Patient remains diagnostically complex, with clarity obscured by recently having given birth to her first child, historical diagnosis of ADHD within the context of longstanding substance use, many previous medication trials of unclear length without reported benefit, and symptoms that overlap between several diagnoses. It appears on initial evaluation that patient met criteria for ADHD, inattentive type, including difficulty sustaining attention, failure to follow through that require sustained mental effort, being easily distracted, and forgetfulness. Additionally, patient endorses a history of feeling restless and talking excessively. Additional consideration was given to the possibility that patient may be experiencing postpartum depression, given her report of irritability, difficulty concentrating, decreased energy and decreased appetite.     Medication trials: Zoloft, Celexa, Wellbutrin, gabapentin, buspar, propranolol, amitriptyline, Vistaril, trazodone, Seroquel, Benadryl, clonidine, mirtazapine    Risk Assessment:  An assessment of suicide and violence risk factors was performed as part of this evaluation and is not significantly changed from the last visit.   While future psychiatric events cannot be accurately predicted, the patient does not currently require acute inpatient psychiatric care and does not currently meet [facility] involuntary commitment criteria.      Plan:    Problem: ADHD, inattentive type  Status of problem: improved or improving  Interventions:   -- continue Strattera 60 mg daily    Problem: Anxiety - Unspecified depressive disorder  Status of problem: not improving as expected  Interventions:   -- continue gabapentin 800 mg qAM, 400 mg qPM, 400 mg at bedtime  -- continue Lexapro 15 mg daily  -- check TSH, free T4    Problem: Opioid use disorder   Status of problem:  chronic and stable  Interventions:   -- decrease Subutex to 8 mg qAM, 4 mg at midday, and 8 mg qPM  -- [employer], individual and group therapy  -- UDS per protocol    Psychotherapy provided:  No billable psychotherapy service provided.    Patient has been given this writer's contact information as well as the [facility] Psychiatry urgent line number. The patient has been instructed to call 911 for emergencies.    Patient was seen and plan of care was discussed with the Attending MD,Dr. Su Hoff, who agrees with the above statement and plan.      Subjective:    Interval History:     Patient is the same, feeling happier but feels like depression is still there. Not as irritable, but still having trouble with lack of motivation, not  wanting to shower, not wanting to do anything except sit in bed. Not sure if there's a time of day difference. Right before bed gets a burst of energy, not every night. Sleeping good, sleeping too much, can never get enough sleep. When Jonah went At this time she has decided that she does not want to taper her medication; validated this decision. Patient denies any cravings/relapses or medication side effects, desires to keep her medications the same at this time.     Objective:    Mental Status Exam:  Appearance:    Appears stated age, Well nourished and Well developed   Motor:   No abnormal movements   Speech/Language:    Normal rate, volume, tone, fluency and Language intact, well formed   Mood:   better   Affect:   Mood congruent   Thought process and Associations:   Logical, linear, clear, coherent, goal directed   Abnormal/psychotic thought content:     Denies SI, HI, self harm, delusions, obsessions, paranoid ideation, or ideas of reference   Perceptual disturbances:     Denies auditory and visual hallucinations, behavior not concerning for response to internal stimuli     Other:            Visit was completed by video (or phone) and the appropriate disclaimer has been included below.             I spent 30 minutes on the real-time audio and video with the patient on the date of service. I spent an additional 15 minutes on pre- and post-visit activities on the date of service.     The patient was physically located in [place] or a state in which I am permitted to provide care. The patient and/or parent/guardian understood that s/he may incur co-pays and cost sharing, and agreed to the telemedicine visit. The visit was reasonable and appropriate under the circumstances given the patient's presentation at the time.    The patient and/or parent/guardian has been advised of the potential risks and limitations of this mode of treatment (including, but not limited to, the absence of in-person examination) and has agreed to be treated using telemedicine. The patient's/patient's family's questions regarding telemedicine have been answered.     If the visit was completed in an ambulatory setting, the patient and/or parent/guardian has also been advised to contact their provider???s office for worsening conditions, and seek emergency medical treatment and/or call 911 if the patient deems either necessary.            Verlee Monte, MD

## 2020-04-25 MED ORDER — ESCITALOPRAM 10 MG TABLET
ORAL_TABLET | Freq: Every day | ORAL | 0 refills | 30.00000 days | Status: CP
Start: 2020-04-25 — End: 2020-05-09

## 2020-04-25 NOTE — Unmapped (Signed)
[facility]  Psychiatry   Established Patient E&M Service - Outpatient       Assessment:    Ebony Chandler presents for follow-up evaluation. On 11/1, patient's symptoms were improved, with decreased irritability and less feelings of isolation. She did not start taking a decreased dose of Subutex, as the idea of doing so caused her significant anxiety. Overall, she was clinically stable and our treatment plan was to re-increase her Subutex to 8 mg TID.     Identifying Information:  Ebony Chandler is a 24 y.o. female with a history of with a history of generalized anxiety disorder, ADHD, and an unspecified mood disorder (possibly depression) who presented for evaluation of lack of motivation, difficulty concentrating and irritable mood.   ??  She moved into [facility] campus on 12/17/19, and is now at [facility]. She has one son, [name], who was born 01/12/2020.   ??  Patient remains diagnostically complex, with clarity obscured by recently having given birth to her first child, historical diagnosis of ADHD within the context of longstanding substance use, many previous medication trials of unclear length without reported benefit, and symptoms that overlap between several diagnoses. It appears on initial evaluation that patient met criteria for ADHD, inattentive type, including difficulty sustaining attention, failure to follow through that require sustained mental effort, being easily distracted, and forgetfulness. Additionally, patient endorses a history of feeling restless and talking excessively. Additional consideration was given to the possibility that patient may be experiencing postpartum depression, given her report of irritability, difficulty concentrating, decreased energy and decreased appetite.     Medication trials: Zoloft, Celexa, Wellbutrin, gabapentin, buspar, propranolol, amitriptyline, Vistaril, trazodone, Seroquel, Benadryl, clonidine, mirtazapine    Risk Assessment:  An assessment of suicide and violence risk factors was performed as part of this evaluation and is not significantly changed from the last visit.   While future psychiatric events cannot be accurately predicted, the patient does not currently require acute inpatient psychiatric care and does not currently meet [facility] involuntary commitment criteria.      Plan:    Problem: ADHD, inattentive type  Status of problem: improved or improving  Interventions:   -- continue Strattera 60 mg daily    Problem: Anxiety - Unspecified depressive disorder  Status of problem: improved or improving  Interventions:   -- continue gabapentin 800 mg qAM, 400 mg qPM, 400 mg at bedtime  -- continue Lexapro 15 mg daily  -- check TSH, free T4    Problem: Opioid use disorder   Status of problem:  chronic and stable  Interventions:   -- increase Subutex to 8 mg qAM, 8 mg at midday, and 8 mg qPM  -- [employer], individual and group therapy  -- UDS per protocol    Psychotherapy provided:  No billable psychotherapy service provided.    Patient has been given this writer's contact information as well as the [facility] Psychiatry urgent line number. The patient has been instructed to call 911 for emergencies.    Patient was seen and plan of care was discussed with the Attending MD,Dr. Su Hoff, who agrees with the above statement and plan.      Subjective:    Interval History:   Patient states that her depression is getting better, states she has felt this way after moving in the summers.  States that summer she has more opportunities to socialize and feels less isolated.  States that she is less irritable.  States that she  is sleeping pretty well, wakes up once a night with her baby.  Appetite is still low.  Patient states that the idea of actually starting to taper her Suboxone caused her anxiety and felt like she was minimizing how much of this that this was.  Did not decrease the dose this week as she felt comfortable with her current dose.  Patient states that she is starting school this week. Patient also states she had a good conversation with someone from the program that her boyfriend is partaking in.  Hasn't been able to see her boyfriend but gets updates from his sister.  States she is a little nervous about seeing him for Thanksgiving but otherwise feels good about this.      Objective:    Mental Status Exam:  Appearance:    Appears stated age, Well nourished and Well developed   Motor:   No abnormal movements   Speech/Language:    Normal rate, volume, tone, fluency and Language intact, well formed   Mood:   good   Affect:   Calm and Cooperative   Thought process and Associations:   Logical, linear, clear, coherent, goal directed   Abnormal/psychotic thought content:     Denies SI, HI, self harm, delusions, obsessions, paranoid ideation, or ideas of reference   Perceptual disturbances:     Denies auditory and visual hallucinations, behavior not concerning for response to internal stimuli     Other:            Visit was completed by video (or phone) and the appropriate disclaimer has been included below.             I spent 27 minutes on the real-time audio and video with the patient on the date of service. I spent an additional 15 minutes on pre- and post-visit activities on the date of service.     The patient was physically located in [place] or a state in which I am permitted to provide care. The patient and/or parent/guardian understood that s/he may incur co-pays and cost sharing, and agreed to the telemedicine visit. The visit was reasonable and appropriate under the circumstances given the patient's presentation at the time.    The patient and/or parent/guardian has been advised of the potential risks and limitations of this mode of treatment (including, but not limited to, the absence of in-person examination) and has agreed to be treated using telemedicine. The patient's/patient's family's questions regarding telemedicine have been answered.     If the visit was completed in an ambulatory setting, the patient and/or parent/guardian has also been advised to contact their provider???s office for worsening conditions, and seek emergency medical treatment and/or call 911 if the patient deems either necessary.            Verlee Monte, MD

## 2020-05-04 NOTE — Unmapped (Signed)
This was a telehealth service where a resident was involved. As the attending physician, I spent 5 minutes in medical discussion with the patient via real-time audio and video, participating in the key portions of the service.I spent an additional 15 minutes on pre- and post-visit activities which were specific to the patient and included reviewing the patient???s medical records, lab results, imaging results, and other pertinent records. I reviewed the resident's note and I agree with the resident's findings and plan.     Zelphia Cairo, MD

## 2020-05-06 NOTE — Unmapped (Signed)
Reason for call: Coordinate shipment of medication and set up apt    Hep C  Genotype:2 (11/17/19)  Treatment: Mavyret x 8 wks  Start date: 04/15/20  Fibrosis: no cirrhosis  TW # 3    I called and spoke with patient at phone # [phone]. Pt stated she has been taking Mavyret  (3) pills every night at 6:30 pm. Pt reports eating a tv dinner or something of substance with her medication. Pt  Reports opening a packet  and one pill dropped in the garbage. Pt wondered what to do with that packet that has 2 pills left.  Notified pt she can take the 2 remaining pills as a dose. She will just take 2 pills instead of 3 tonight. Pt denies any side effects. Pt is unable to give pill count because staff keeps her medications and hands them out when due. Pt has not missed any doses. Pt has not started any new medications. Pt denies any alcohol use. Pt moved to a new address with [facility]. New address is: [address], [address] [place], [place] [address]. I coordinated a new apt for pt. She needed late afternoon apt after school is out. New apt scheduled with Owens Shark, DNP on 06/01/20 @ 3:30 pm. I did a conference call to [place] at [facility]. Pt will receive shipment of Mavyret on 05/10/20.       Vertell Limber RN, [facility]   Pharmacy Department  [facility]  [address]   [place], [place] [address]  [phone]

## 2020-05-07 NOTE — Unmapped (Signed)
[facility] Shared [facility] Clinical Assessment & Refill Coordination Note    Ebony Chandler, DOB: [birth year]  Phone: [phone] (home)     All above HIPAA information was verified with patient.     Was a Nurse, learning disability used for this call? No    Specialty Medication(s):   Infectious Disease: Mavyret     Current Outpatient Medications   Medication Sig Dispense Refill   ??? acetaminophen (TYLENOL) 325 MG tablet Take 2 tablets (650 mg total) by mouth Every six (6) hours. 40 tablet 2   ??? atomoxetine (STRATTERA) 60 MG capsule Take 1 capsule (60 mg total) by mouth daily with lunch. 30 capsule 1   ??? benzocaine 20 % Liqd Apply to affected area 4 times a day as needed 30 mL 1   ??? buprenorphine HCL (SUBUTEX) 8 mg tablet Place 1 tablet (8 mg total) under the tongue Three (3) times a day for 28 days. 84 tablet 0   ??? escitalopram oxalate (LEXAPRO) 10 MG tablet Take 1.5 tablets (15 mg total) by mouth daily. 45 tablet 0   ??? gabapentin (NEURONTIN) 400 MG capsule Take 1 capsule (400 mg total) by mouth Four (4) times a day. 120 capsule 1   ??? glecaprevir-pibrentasvir (MAVYRET) 100-40 mg tablet Take 3 tablets by mouth daily with food. 84 tablet 1   ??? ibuprofen (MOTRIN) 600 MG tablet Take 1 tablet (600 mg total) by mouth Every six (6) hours. 40 tablet 2   ??? magnesium oxide 200 mg magnesium Chew Chew 400 mg daily as needed (headaches). 30 tablet 3   ??? naloxone (NARCAN) 4 mg/actuation nasal spray One spray in either nostril once for known/suspected opioid overdose. May repeat every 2-3 minutes in alternating nostril til EMS arrives 2 each 0   ??? nicotine (NICODERM CQ) 21 mg/24 hr patch Place 1 patch on the skin daily. 28 patch 0   ??? NIFEdipine (PROCARDIA XL) 30 MG 24 hr tablet Take 1 tablet (30 mg total) by mouth daily. 30 tablet 1   ??? prenat.vits,cal,min-iron-folic (PRENATAL VITAMIN) Tab tablet Take 1 tablet by mouth daily. 30 tablet 11   ??? riboflavin, vitamin B2, 100 mg Tab Take 1 tablet (100 mg total) by mouth daily as needed (headaches). 30 tablet 3     No current facility-administered medications for this visit.        Changes to medications: Tamarra reports no changes at this time.    No Known Allergies    Changes to allergies: No    SPECIALTY MEDICATION ADHERENCE     Mavyret   : estimated 7 days of medicine on hand based on start date on 04/15/20.  [facility] had the medicine preventing a pill count      Medication Adherence    Patient reported X missed doses in the last month: 0  Specialty Medication: Mavyret  Patient is on additional specialty medications: No  Demonstrates understanding of importance of adherence: yes  Informant: patient  Provider-estimated medication adherence level: good  Patient is at risk for Non-Adherence: No  Confirmed plan for next specialty medication refill: delivery by pharmacy          Specialty medication(s) dose(s) confirmed: Regimen is correct and unchanged.     Are there any concerns with adherence? No    Adherence counseling provided? Not needed    CLINICAL MANAGEMENT AND INTERVENTION      Clinical Benefit Assessment:    Do you feel the medicine is effective or helping your condition? Yes  Clinical Benefit counseling provided? Not needed    Adverse Effects Assessment:    Are you experiencing any side effects? No    Are you experiencing difficulty administering your medicine? No    Quality of Life Assessment:    How many days over the past month did your Hepatitis C  keep you from your normal activities? For example, brushing your teeth or getting up in the morning. 0    Have you discussed this with your provider? Not needed    Therapy Appropriateness:    Is therapy appropriate? Yes, therapy is appropriate and should be continued    DISEASE/MEDICATION-SPECIFIC INFORMATION      For Hepatitis C patients (clinical assessment):  Regimen: Mavyret x 8 weeks  Therapy start date: 04/15/20  Completed Treatment Week #: 3  What time of day do you take your medicine? 6:30pm  Unable to perform pill count since [facility] has medicine. Patient states has not missed any doses,  Are you taking any new OTC or herbal medication? No  Any alcoholic beverages? No  New pregnancy in females of child bearing age?  No.  Ensure contraception plan is continued at refill.  Do you have a follow up appointment? Yes, appointment is scheduled, patient is aware, and no identified barriers    PATIENT SPECIFIC NEEDS     - Does the patient have any physical, cognitive, or cultural barriers? No    - Is the patient high risk? No    - Does the patient require a Care Management Plan? No     - Does the patient require physician intervention or other additional services (i.e. nutrition, smoking cessation, social work)? No      SHIPPING     Specialty Medication(s) to be Shipped:   Infectious Disease: Mavyret    Other medication(s) to be shipped: No additional medications requested for fill at this time     Changes to insurance: No    Delivery Scheduled: Yes, Expected medication delivery date: 05/10/20.     Medication will be delivered via Next Day Courier to the confirmed prescription address in [facility].    The patient will receive a drug information handout for each medication shipped and additional FDA Medication Guides as required.  Verified that patient has previously received a Conservation officer, historic buildings.    All of the patient's questions and concerns have been addressed.    Roderic Palau   [facility] Shared [facility] Specialty Pharmacist

## 2020-05-07 NOTE — Unmapped (Signed)
This patient has been disenrolled from the [facility] specialty pharmacy services due to therapy completion - expected therapy completion date: 06/09/20.  Her last shipment of Mavyret will be delivered on 05/10/20.    Roderic Palau  [facility] Shared [facility] Specialty Pharmacist

## 2020-05-09 MED ORDER — NICOTINE (POLACRILEX) 2 MG GUM
BUCCAL | 0 refills | 10 days | Status: CP | PRN
Start: 2020-05-09 — End: 2020-06-08

## 2020-05-09 MED ORDER — ESCITALOPRAM 10 MG TABLET
ORAL_TABLET | Freq: Every day | ORAL | 0 refills | 30 days | Status: CP
Start: 2020-05-09 — End: 2020-05-16

## 2020-05-09 MED FILL — MAVYRET 100 MG-40 MG TABLET: 28 days supply | Qty: 84 | Fill #1 | Status: AC

## 2020-05-09 MED FILL — MAVYRET 100 MG-40 MG TABLET: ORAL | 28 days supply | Qty: 84 | Fill #1

## 2020-05-12 DIAGNOSIS — F1120 Opioid dependence, uncomplicated: Principal | ICD-10-CM

## 2020-05-12 MED ORDER — BUPRENORPHINE HCL 8 MG SUBLINGUAL TABLET
ORAL_TABLET | Freq: Three times a day (TID) | SUBLINGUAL | 0 refills | 28.00000 days | Status: CP
Start: 2020-05-12 — End: 2020-06-09

## 2020-05-16 MED ORDER — ESCITALOPRAM 20 MG TABLET
ORAL_TABLET | Freq: Every day | ORAL | 0 refills | 30.00000 days | Status: CP
Start: 2020-05-16 — End: 2020-06-15

## 2020-05-16 MED ORDER — GABAPENTIN 400 MG CAPSULE
ORAL_CAPSULE | Freq: Four times a day (QID) | ORAL | 1 refills | 30.00000 days | Status: CP
Start: 2020-05-16 — End: 2020-07-04

## 2020-05-16 MED ORDER — NIFEDIPINE ER 30 MG TABLET,EXTENDED RELEASE 24 HR
ORAL_TABLET | Freq: Every day | ORAL | 1 refills | 30 days | Status: CP
Start: 2020-05-16 — End: 2020-07-18

## 2020-05-16 MED ORDER — ATOMOXETINE 60 MG CAPSULE
ORAL_CAPSULE | Freq: Every day | ORAL | 1 refills | 30.00000 days | Status: CP
Start: 2020-05-16 — End: 2020-07-04

## 2020-06-09 DIAGNOSIS — F1120 Opioid dependence, uncomplicated: Principal | ICD-10-CM

## 2020-06-09 MED ORDER — BUPRENORPHINE HCL 8 MG SUBLINGUAL TABLET
ORAL_TABLET | SUBLINGUAL | 0 refills | 28.00000 days | Status: CP
Start: 2020-06-09 — End: 2020-07-07

## 2020-06-09 MED ORDER — BUPRENORPHINE HCL 8 MG SUBLINGUAL TABLET: 8 mg | tablet | 0 refills | 28 days | Status: AC

## 2020-06-14 MED ORDER — ESCITALOPRAM 20 MG TABLET
ORAL_TABLET | Freq: Every day | ORAL | 0 refills | 30.00000 days | Status: CP
Start: 2020-06-14 — End: 2020-07-04

## 2020-06-20 ENCOUNTER — Ambulatory Visit
Admit: 2020-06-20 | Discharge: 2020-06-21 | Payer: MEDICAID | Attending: Physician Assistant | Primary: Physician Assistant

## 2020-06-20 DIAGNOSIS — Z20822 Contact with and (suspected) exposure to covid-19: Principal | ICD-10-CM

## 2020-06-20 DIAGNOSIS — Z20828 Contact with and (suspected) exposure to other viral communicable diseases: Principal | ICD-10-CM

## 2020-06-29 ENCOUNTER — Ambulatory Visit: Admit: 2020-06-29 | Discharge: 2020-06-30 | Payer: MEDICAID | Attending: Family Medicine | Primary: Family Medicine

## 2020-06-29 DIAGNOSIS — F1120 Opioid dependence, uncomplicated: Principal | ICD-10-CM

## 2020-06-29 DIAGNOSIS — F17200 Nicotine dependence, unspecified, uncomplicated: Principal | ICD-10-CM

## 2020-06-29 DIAGNOSIS — Z3009 Encounter for other general counseling and advice on contraception: Principal | ICD-10-CM

## 2020-06-29 DIAGNOSIS — R058 Cough present for greater than 3 weeks: Principal | ICD-10-CM

## 2020-06-29 DIAGNOSIS — B182 Chronic viral hepatitis C: Principal | ICD-10-CM

## 2020-06-29 DIAGNOSIS — K649 Unspecified hemorrhoids: Principal | ICD-10-CM

## 2020-06-29 DIAGNOSIS — Z8759 Personal history of other complications of pregnancy, childbirth and the puerperium: Principal | ICD-10-CM

## 2020-06-29 MED ORDER — HYDROCORTISONE ACETATE 25 MG RECTAL SUPPOSITORY
Freq: Two times a day (BID) | RECTAL | 0 refills | 7 days | Status: CP
Start: 2020-06-29 — End: 2021-06-29

## 2020-07-01 DIAGNOSIS — I10 Essential (primary) hypertension: Principal | ICD-10-CM

## 2020-07-04 MED ORDER — GABAPENTIN 400 MG CAPSULE
ORAL_CAPSULE | Freq: Two times a day (BID) | ORAL | 1 refills | 30.00000 days | Status: CP
Start: 2020-07-04 — End: 2020-08-15

## 2020-07-04 MED ORDER — ATOMOXETINE 60 MG CAPSULE
ORAL_CAPSULE | Freq: Every morning | ORAL | 1 refills | 30.00000 days | Status: CP
Start: 2020-07-04 — End: 2020-08-15

## 2020-07-04 MED ORDER — ESCITALOPRAM 20 MG TABLET
ORAL_TABLET | Freq: Every day | ORAL | 0 refills | 30.00000 days | Status: CP
Start: 2020-07-04 — End: 2020-08-08

## 2020-07-07 DIAGNOSIS — F1120 Opioid dependence, uncomplicated: Principal | ICD-10-CM

## 2020-07-07 MED ORDER — BUPRENORPHINE HCL 8 MG SUBLINGUAL TABLET
ORAL_TABLET | SUBLINGUAL | 0 refills | 28 days | Status: CP
Start: 2020-07-07 — End: 2020-08-04

## 2020-07-18 MED ORDER — NIFEDIPINE ER 30 MG TABLET,EXTENDED RELEASE 24 HR
ORAL_TABLET | Freq: Every day | ORAL | 1 refills | 30 days | Status: CP
Start: 2020-07-18 — End: 2020-09-16

## 2020-08-04 DIAGNOSIS — F1120 Opioid dependence, uncomplicated: Principal | ICD-10-CM

## 2020-08-04 MED ORDER — BUPRENORPHINE HCL 8 MG SUBLINGUAL TABLET
ORAL_TABLET | SUBLINGUAL | 0 refills | 28 days | Status: CP
Start: 2020-08-04 — End: 2020-09-01

## 2020-08-08 MED ORDER — ESCITALOPRAM 20 MG TABLET
ORAL_TABLET | Freq: Every day | ORAL | 0 refills | 30.00000 days | Status: CP
Start: 2020-08-08 — End: 2020-08-15

## 2020-08-15 MED ORDER — GABAPENTIN 400 MG CAPSULE
ORAL_CAPSULE | Freq: Two times a day (BID) | ORAL | 1 refills | 30 days | Status: CP
Start: 2020-08-15 — End: 2020-10-14

## 2020-08-15 MED ORDER — ESCITALOPRAM 20 MG TABLET
ORAL_TABLET | Freq: Every day | ORAL | 0 refills | 30 days | Status: CP
Start: 2020-08-15 — End: 2020-09-14

## 2020-08-15 MED ORDER — ATOMOXETINE 60 MG CAPSULE
ORAL_CAPSULE | Freq: Every morning | ORAL | 1 refills | 30 days | Status: CP
Start: 2020-08-15 — End: 2020-10-14

## 2020-08-29 MED ORDER — ESCITALOPRAM 20 MG TABLET
ORAL_TABLET | Freq: Every day | ORAL | 0 refills | 30.00000 days | Status: CP
Start: 2020-08-29 — End: 2020-09-28

## 2020-09-01 DIAGNOSIS — F1120 Opioid dependence, uncomplicated: Principal | ICD-10-CM

## 2020-09-01 MED ORDER — BUPRENORPHINE HCL 8 MG SUBLINGUAL TABLET
ORAL_TABLET | SUBLINGUAL | 0 refills | 28.00000 days | Status: CP
Start: 2020-09-01 — End: 2020-09-29

## 2020-09-10 DIAGNOSIS — I10 Essential (primary) hypertension: Principal | ICD-10-CM

## 2020-09-10 MED ORDER — NIFEDIPINE ER 30 MG TABLET,EXTENDED RELEASE 24 HR
ORAL_TABLET | 4 refills | 0 days
Start: 2020-09-10 — End: ?

## 2020-09-26 MED ORDER — GABAPENTIN 400 MG CAPSULE
ORAL_CAPSULE | Freq: Two times a day (BID) | ORAL | 1 refills | 30 days | Status: CP
Start: 2020-09-26 — End: 2020-11-25

## 2020-09-26 MED ORDER — ATOMOXETINE 60 MG CAPSULE
ORAL_CAPSULE | Freq: Every morning | ORAL | 1 refills | 30.00000 days | Status: CP
Start: 2020-09-26 — End: 2020-11-25

## 2020-09-26 MED ORDER — ESCITALOPRAM 20 MG TABLET
ORAL_TABLET | Freq: Every day | ORAL | 1 refills | 30 days | Status: CP
Start: 2020-09-26 — End: 2020-11-25

## 2020-10-13 DIAGNOSIS — F1120 Opioid dependence, uncomplicated: Principal | ICD-10-CM

## 2020-10-13 MED ORDER — BUPRENORPHINE HCL 8 MG SUBLINGUAL TABLET
ORAL_TABLET | SUBLINGUAL | 0 refills | 7 days | Status: CP
Start: 2020-10-13 — End: 2020-10-20

## 2020-10-20 DIAGNOSIS — F1120 Opioid dependence, uncomplicated: Principal | ICD-10-CM

## 2020-10-20 MED ORDER — BUPRENORPHINE HCL 8 MG SUBLINGUAL TABLET
ORAL_TABLET | SUBLINGUAL | 0 refills | 7 days | Status: CP
Start: 2020-10-20 — End: 2020-10-27

## 2020-10-27 DIAGNOSIS — F1120 Opioid dependence, uncomplicated: Principal | ICD-10-CM

## 2020-10-31 DIAGNOSIS — F1120 Opioid dependence, uncomplicated: Principal | ICD-10-CM

## 2020-10-31 MED ORDER — BUPRENORPHINE HCL 8 MG SUBLINGUAL TABLET
ORAL_TABLET | Freq: Two times a day (BID) | SUBLINGUAL | 0 refills | 7.00000 days | Status: CP
Start: 2020-10-31 — End: 2020-11-07

## 2020-11-01 ENCOUNTER — Emergency Department: Admit: 2020-11-01 | Discharge: 2020-11-01 | Disposition: A | Discharge status: Home / Self Care | Payer: MEDICAID

## 2020-11-01 ENCOUNTER — Ambulatory Visit: Admit: 2020-11-01 | Discharge: 2020-11-01 | Disposition: A | Discharge status: Home / Self Care | Payer: MEDICAID

## 2020-11-01 DIAGNOSIS — J189 Pneumonia, unspecified organism: Principal | ICD-10-CM

## 2020-11-01 MED ORDER — AMOXICILLIN 875 MG-POTASSIUM CLAVULANATE 125 MG TABLET
ORAL_TABLET | Freq: Two times a day (BID) | ORAL | 0 refills | 7 days | Status: CP
Start: 2020-11-01 — End: 2020-11-08

## 2020-11-07 DIAGNOSIS — F1120 Opioid dependence, uncomplicated: Principal | ICD-10-CM

## 2020-11-07 MED ORDER — BUPRENORPHINE HCL 2 MG SUBLINGUAL TABLET
ORAL_TABLET | Freq: Every evening | SUBLINGUAL | 0 refills | 7 days | Status: CP
Start: 2020-11-07 — End: 2020-11-14

## 2020-11-07 MED ORDER — BUPRENORPHINE HCL 8 MG SUBLINGUAL TABLET
ORAL_TABLET | Freq: Every morning | SUBLINGUAL | 0 refills | 7.00000 days | Status: CP
Start: 2020-11-07 — End: 2020-11-14

## 2020-11-10 DIAGNOSIS — F1120 Opioid dependence, uncomplicated: Principal | ICD-10-CM

## 2020-11-10 MED ORDER — BUPRENORPHINE HCL 8 MG SUBLINGUAL TABLET
ORAL_TABLET | 0 refills | 7 days | Status: CP
Start: 2020-11-10 — End: 2020-11-17

## 2020-11-10 MED ORDER — BUPRENORPHINE HCL 2 MG SUBLINGUAL TABLET
ORAL_TABLET | Freq: Every evening | SUBLINGUAL | 0 refills | 7.00000 days | Status: CP
Start: 2020-11-10 — End: 2020-11-17

## 2020-11-14 MED ORDER — ATOMOXETINE 60 MG CAPSULE
ORAL_CAPSULE | Freq: Every morning | ORAL | 1 refills | 30 days | Status: CP
Start: 2020-11-14 — End: 2021-01-13

## 2020-11-14 MED ORDER — ESCITALOPRAM 20 MG TABLET
ORAL_TABLET | Freq: Every day | ORAL | 1 refills | 30 days | Status: CP
Start: 2020-11-14 — End: 2021-01-13

## 2020-11-16 ENCOUNTER — Ambulatory Visit: Admit: 2020-11-16 | Payer: MEDICAID | Attending: Family | Primary: Family

## 2020-11-17 DIAGNOSIS — F1120 Opioid dependence, uncomplicated: Principal | ICD-10-CM

## 2020-11-17 MED ORDER — BUPRENORPHINE HCL 2 MG SUBLINGUAL TABLET
ORAL_TABLET | Freq: Every evening | SUBLINGUAL | 0 refills | 7 days | Status: CP
Start: 2020-11-17 — End: 2020-11-24

## 2020-11-17 MED ORDER — BUPRENORPHINE HCL 8 MG SUBLINGUAL TABLET
ORAL_TABLET | 0 refills | 7 days | Status: CP
Start: 2020-11-17 — End: 2020-11-24

## 2020-11-24 DIAGNOSIS — F1120 Opioid dependence, uncomplicated: Principal | ICD-10-CM

## 2020-11-24 MED ORDER — BUPRENORPHINE HCL 8 MG SUBLINGUAL TABLET
ORAL_TABLET | SUBLINGUAL | 0 refills | 14 days | Status: CP
Start: 2020-11-24 — End: 2020-12-08

## 2020-12-08 ENCOUNTER — Ambulatory Visit: Admit: 2020-12-08 | Payer: MEDICAID | Attending: Family Medicine | Primary: Family Medicine

## 2020-12-08 DIAGNOSIS — F1120 Opioid dependence, uncomplicated: Principal | ICD-10-CM

## 2020-12-12 DIAGNOSIS — F419 Anxiety disorder, unspecified: Principal | ICD-10-CM

## 2020-12-12 DIAGNOSIS — F1120 Opioid dependence, uncomplicated: Principal | ICD-10-CM

## 2020-12-12 MED ORDER — GABAPENTIN 100 MG CAPSULE
ORAL_CAPSULE | Freq: Two times a day (BID) | ORAL | 0 refills | 30 days | Status: CP
Start: 2020-12-12 — End: 2021-01-11

## 2020-12-12 MED ORDER — BUPRENORPHINE HCL 2 MG SUBLINGUAL TABLET
ORAL_TABLET | Freq: Every day | SUBLINGUAL | 0 refills | 14 days | Status: CP
Start: 2020-12-12 — End: 2020-12-26

## 2020-12-23 DIAGNOSIS — F1120 Opioid dependence, uncomplicated: Principal | ICD-10-CM

## 2020-12-26 MED ORDER — BUPRENORPHINE HCL 2 MG SUBLINGUAL TABLET
ORAL_TABLET | Freq: Every day | SUBLINGUAL | 0 refills | 14 days | Status: CP
Start: 2020-12-26 — End: 2021-01-09

## 2021-01-08 DIAGNOSIS — F419 Anxiety disorder, unspecified: Principal | ICD-10-CM

## 2021-01-08 MED ORDER — GABAPENTIN 100 MG CAPSULE
ORAL_CAPSULE | Freq: Two times a day (BID) | ORAL | 0 refills | 0 days
Start: 2021-01-08 — End: ?

## 2021-02-27 IMAGING — US US RENAL
1 series · 13 of 25 positions shown · non-contrast
Comparison: None.

CLINICAL DATA: Flank pain.  Early third trimester gestation

EXAM:
RENAL / URINARY TRACT ULTRASOUND COMPLETE

[Series 1: us renal · 13 of 39 slices shown]
[im 1/39]
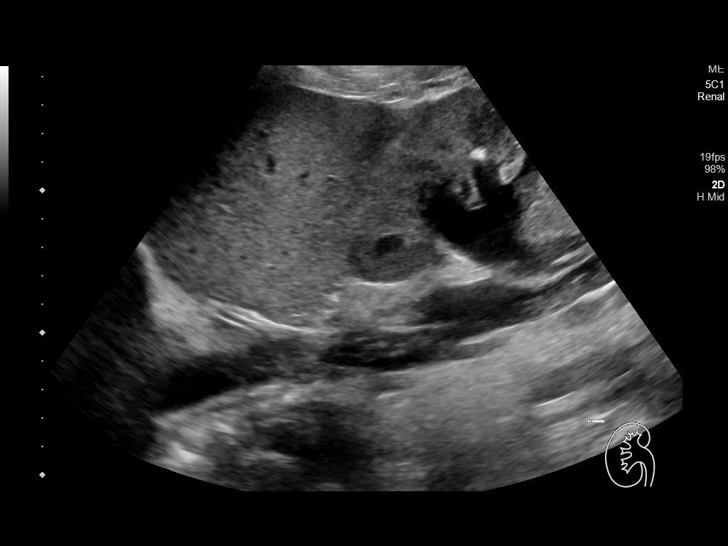
[im 4/39]
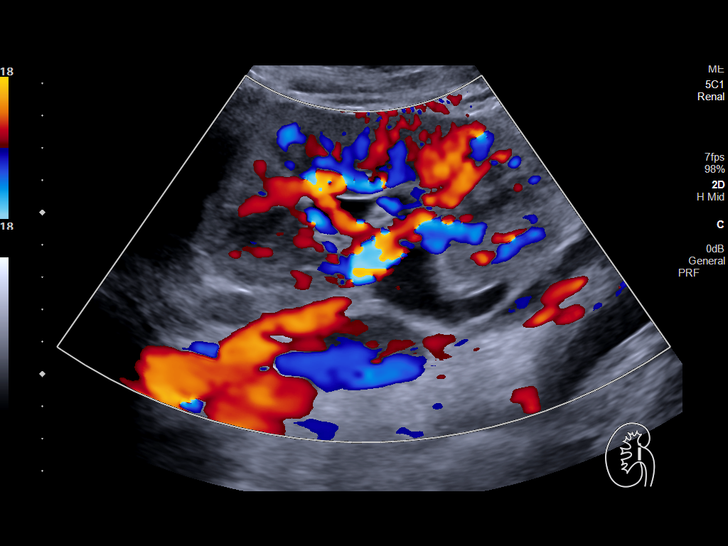
[im 7/39]
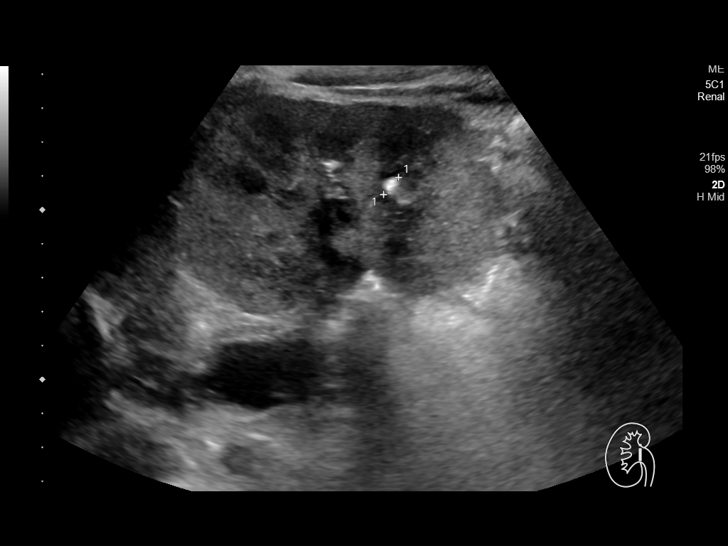
[im 10/39]
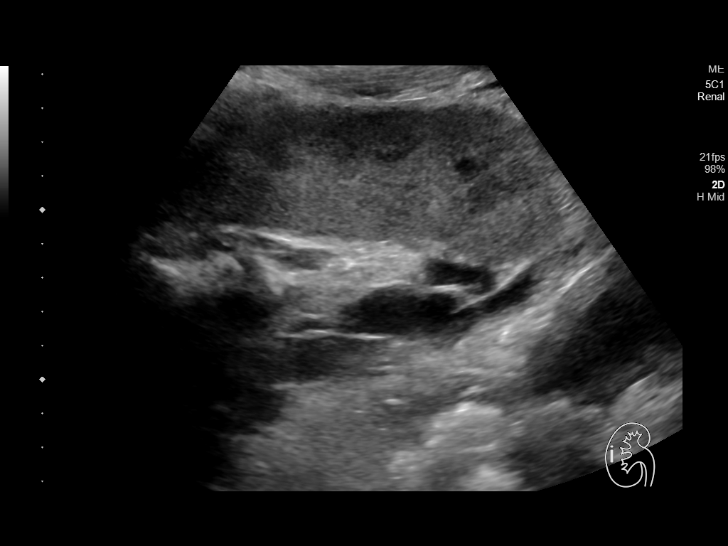
[im 13/39]
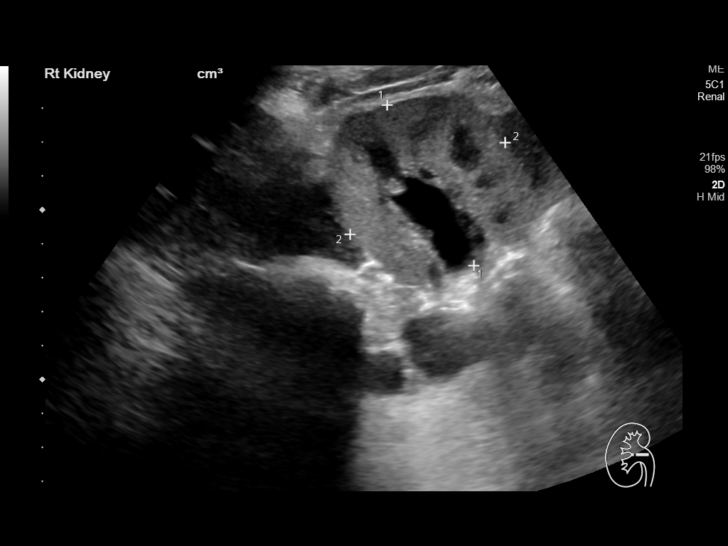
[im 16/39]
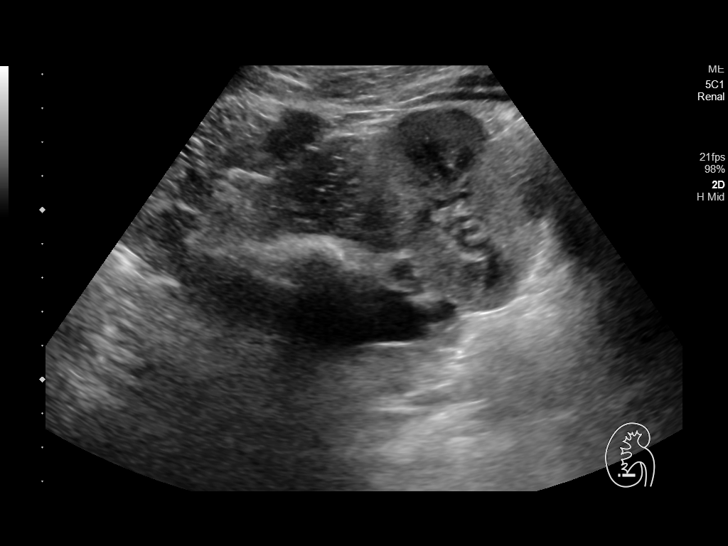
[im 20/39]
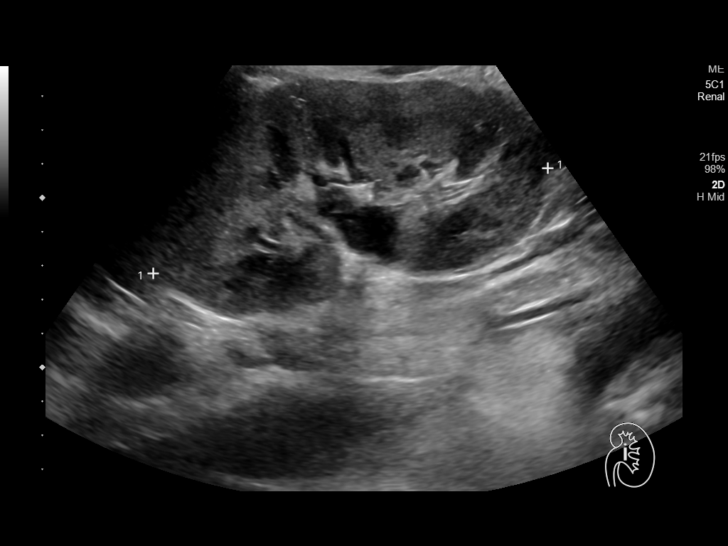
[im 23/39]
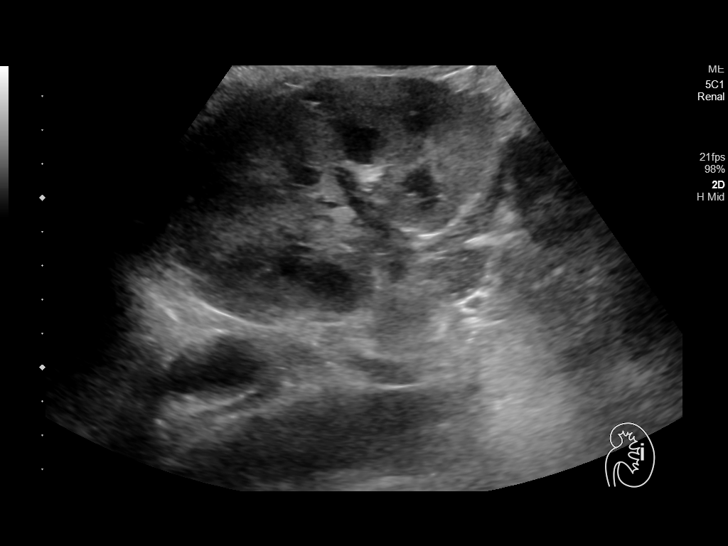
[im 26/39]
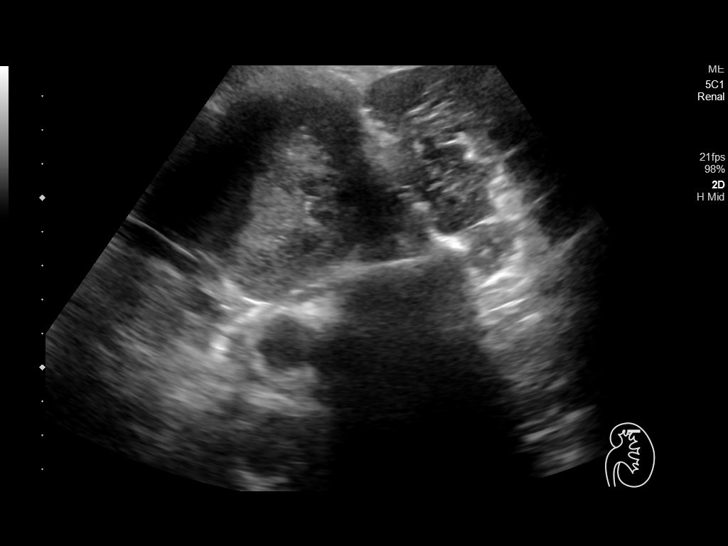
[im 29/39]
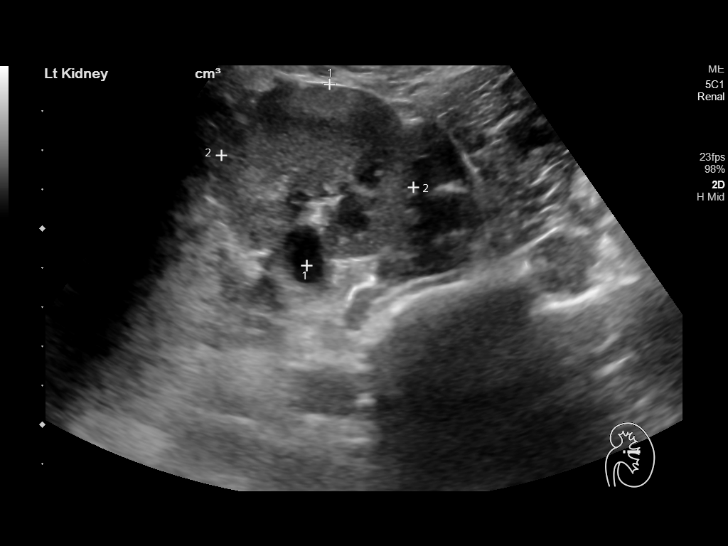
[im 32/39]
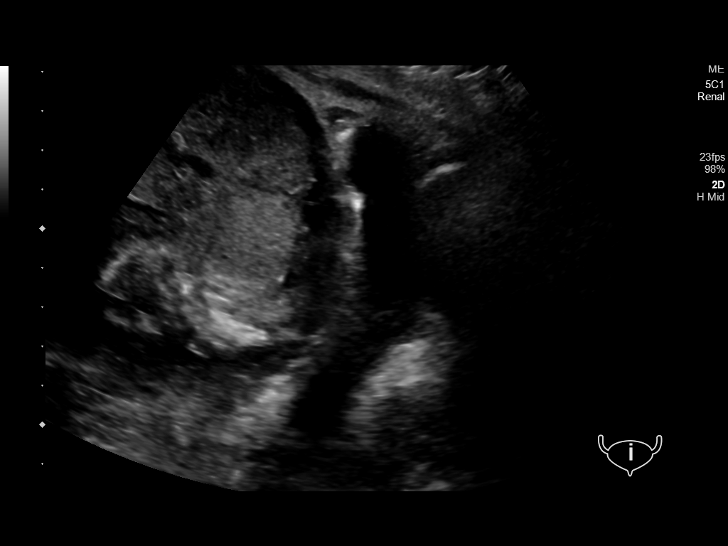
[im 35/39]
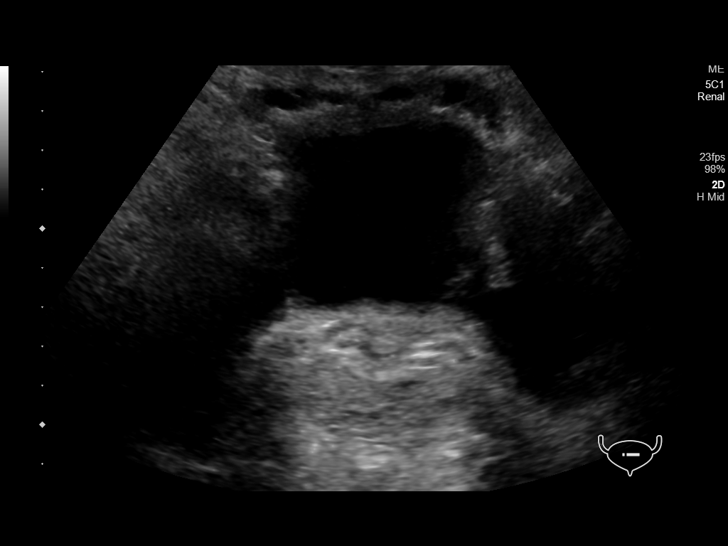
[im 39/39]
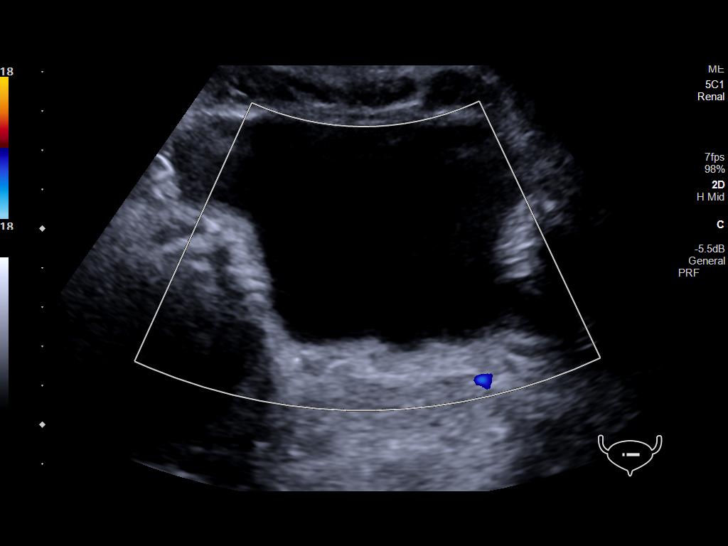

[13 of 25 positions shown; findings below may reference images not displayed]

FINDINGS: Right Kidney:

Renal measurements: 11.6 x 5.4 x 5.3 cm = volume: 174.1 mL .
Echogenicity and renal cortical thickness are within normal limits.
No mass or perinephric fluid visualized. There is moderate
hydronephrosis and proximal ureterectasis on the right. There is a
nonobstructing calculus in the mid to lower right kidney measuring 7
mm. No ureteral calculus seen in visualized portions of right
ureter.

Left Kidney:

Renal measurements: 12.0 x 4.7 x 5.0 cm = volume: 145.7 mL.
Echogenicity and renal cortical thickness are within normal limits.
No mass or perinephric fluid visualized. There is mild
hydronephrosis on the left without ureterectasis. No sonographically
demonstrable calculus.

Bladder:

Appears normal for degree of bladder distention. Flow from each
distal ureter cannot be delineated on this study.

Other:

None.
IMPRESSION: 1. Moderate hydronephrosis on the right. Slight hydronephrosis on
the left. Proximal ureterectasis noted on the right. No focal
obstructing ureteral lesions evident. Fullness of the collecting
systems in this circumstance could be due to impression from the
intrauterine gestation. Obstructing calculus, particular on the
right, cannot be excluded on this study.

2.  Nonobstructing 7 mm calculus mid to lower right kidney.

3.  Study otherwise unremarkable.

These results will be called to the ordering clinician or
representative by the Radiologist Assistant, and communication
documented in the PACS or [REDACTED].

## 2021-02-27 IMAGING — US US OB COMP +14 WK
1 series · 13 of 28 positions shown · non-contrast
Comparison: none

CLINICAL DATA: Substance abuse complicating 2nd trimester
pregnancy. Jaundice.

EXAM:
OBSTETRICAL ULTRASOUND >14 WKS

[Series 1: us ob comp + 14 wk · 13 of 67 slices shown]
[im 3/67]
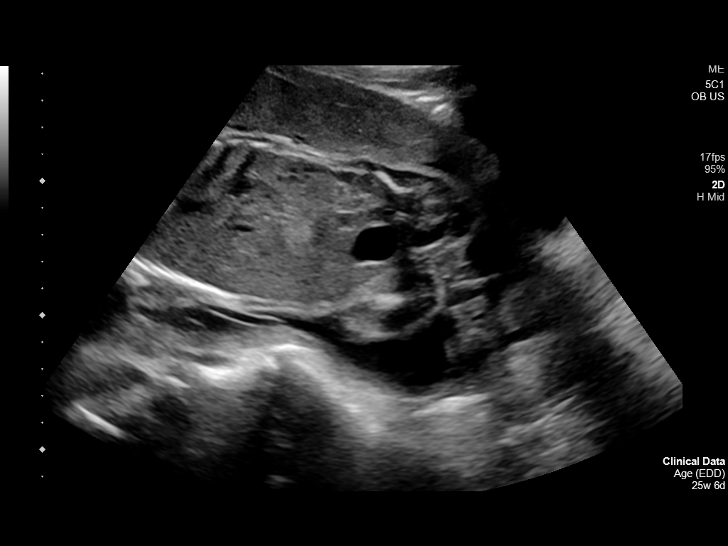
[im 8/67]
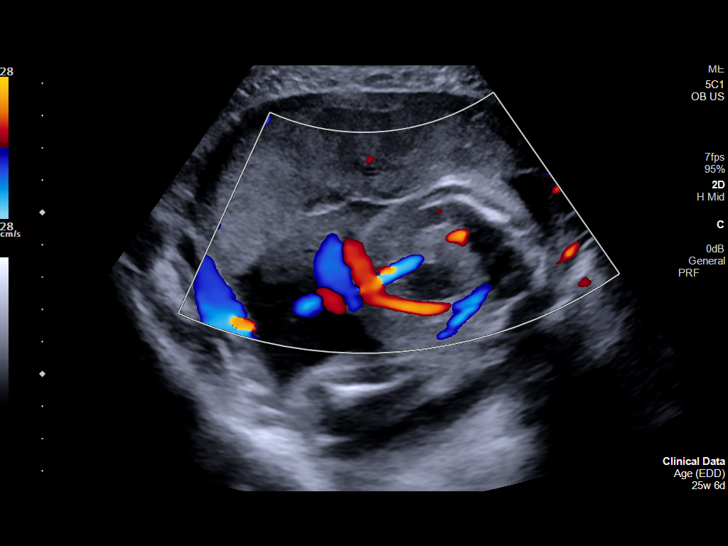
[im 13/67]
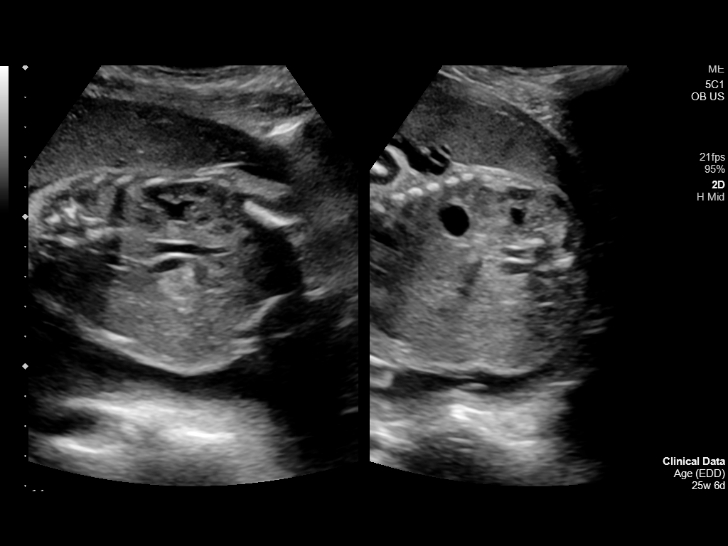
[im 18/67]
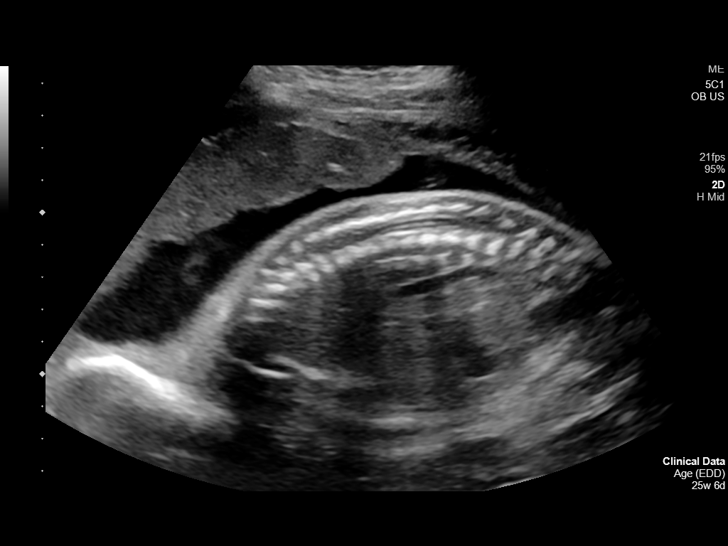
[im 23/67]
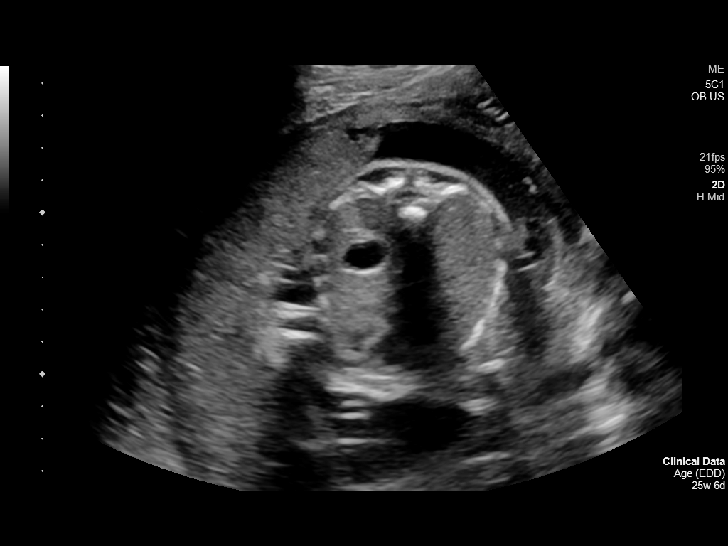
[im 27/67]
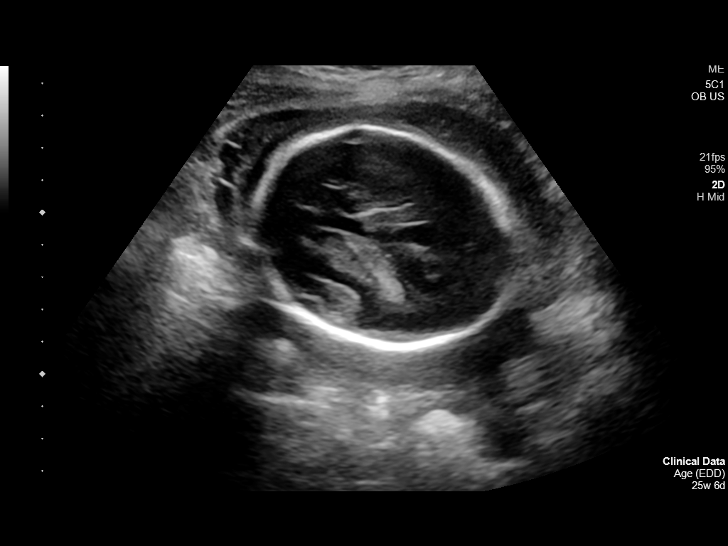
[im 35/67]
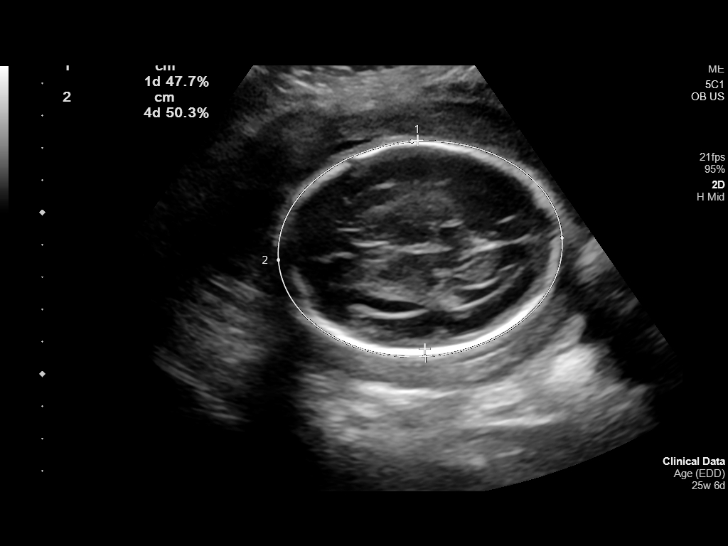
[im 40/67]
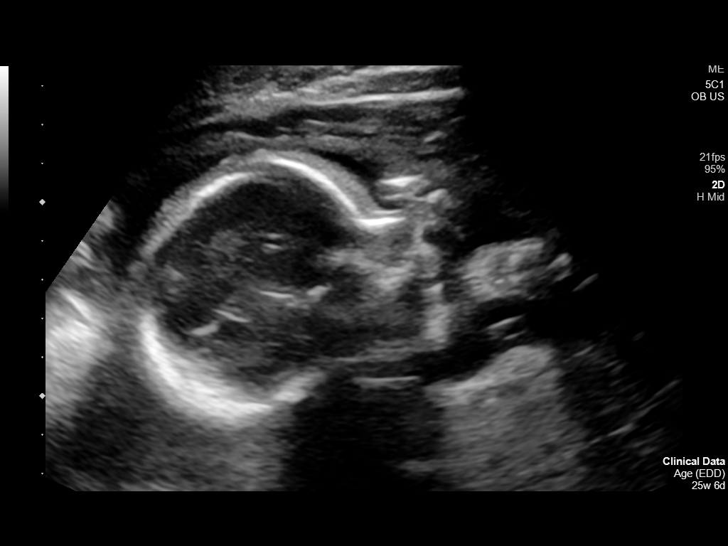
[im 45/67]
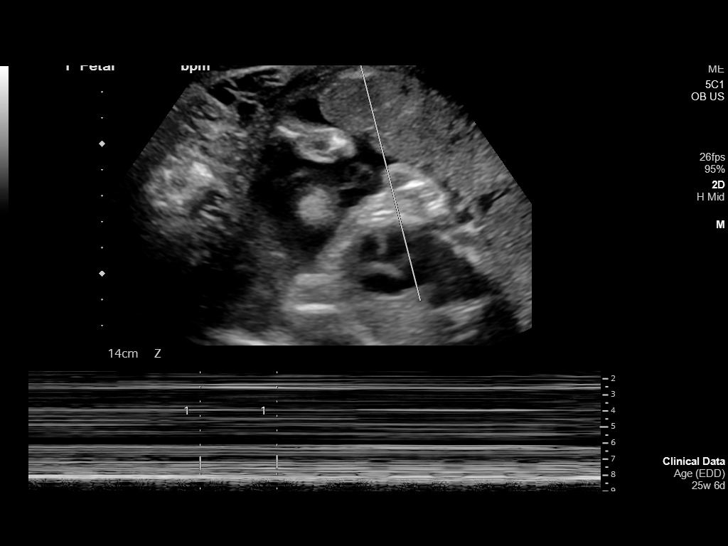
[im 49/67]
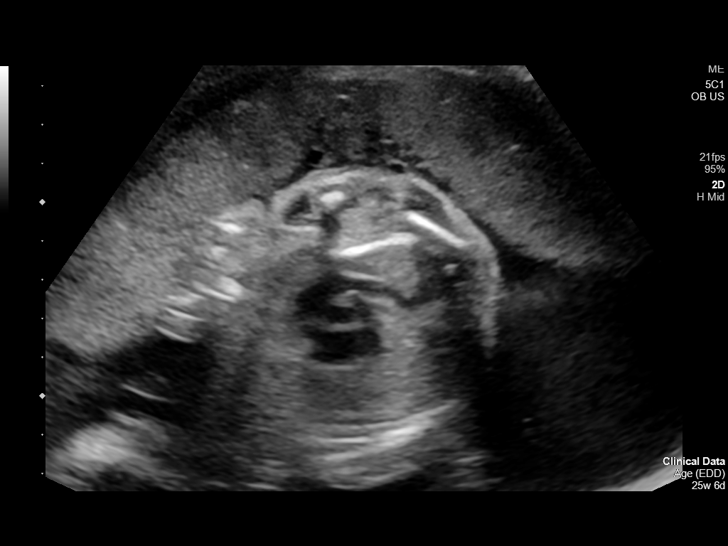
[im 54/67]
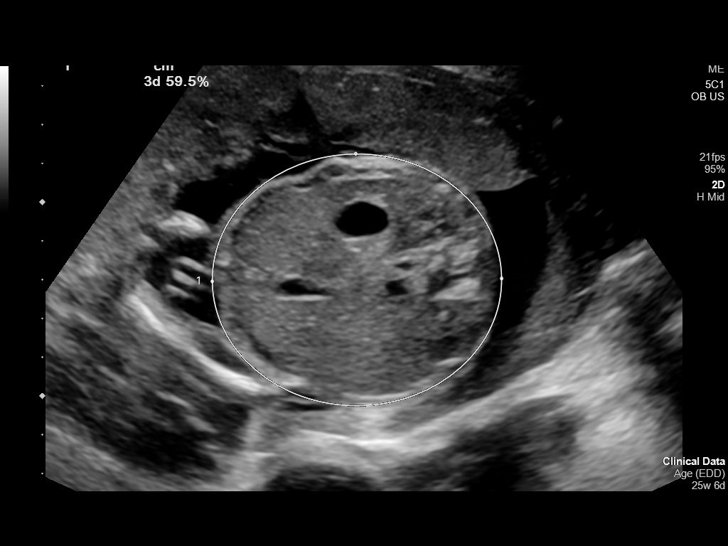
[im 59/67]
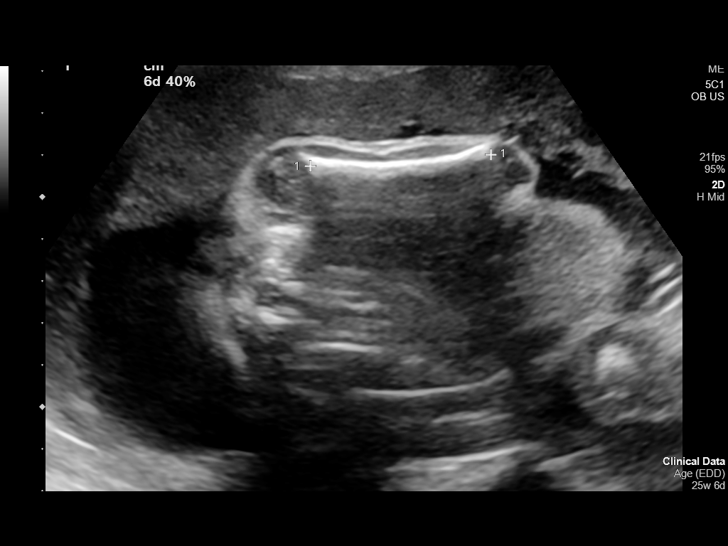
[im 64/67]
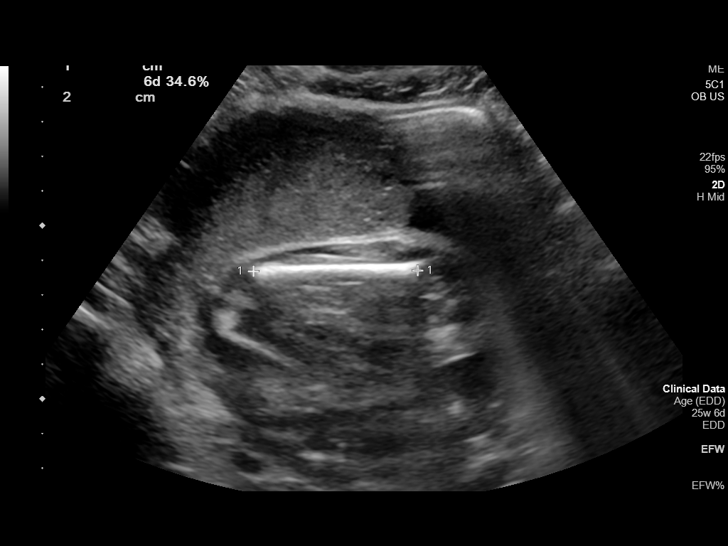

[13 of 28 positions shown; findings below may reference images not displayed]

FINDINGS: Number of Fetuses: 1

Heart Rate:  133 bpm

Movement: Yes

Presentation: Breech

Previa: No

Placental Location: Anterior

Amniotic Fluid (Subjective): Within normal limits

Amniotic Fluid (Objective):

Vertical pocket = 7.0cm

FETAL BIOMETRY

BPD: 6.5cm 26w 1d

HC:   24.5cm 26w 4d

AC:   21.9cm 26w 3d

FL:   4.8cm 25w 6d

Current Mean GA: 26w 0d US EDC: 02/07/2020

Assigned GA:  25w 6d Assigned EDC: 02/08/2020

FETAL ANATOMY

Lateral Ventricles: Appears normal

Thalami/CSP: Appears normal

Posterior Fossa:  Appears normal

Nuchal Region: Appears normal   NFT= N/A > 20 WKS

Upper Lip: Appears normal

Spine: Appears normal

4 Chamber Heart on Left: Appears normal

LVOT: Appears normal

RVOT: Appears normal

Stomach on Left: Appears normal

3 Vessel Cord: Appears normal

Cord Insertion site: Appears normal

Kidneys: Appears normal

Bladder: Appears normal

Extremities: Appears normal

Sex: Male

Maternal Findings:

Cervix:  3.6 cm TA
IMPRESSION: Assigned GA currently 25 weeks 6 days.  Appropriate fetal growth.

Unremarkable anatomic survey.  No fetal anomalies identified.

## 2021-02-27 IMAGING — US US ABDOMEN LIMITED
1 series · 14 of 25 positions shown · non-contrast
Comparison: None.

CLINICAL DATA: Jaundice.

EXAM:
ULTRASOUND ABDOMEN LIMITED RIGHT UPPER QUADRANT

[Series 1: us abdomen limited ruq · 14 of 49 slices shown]
[im 1/49]
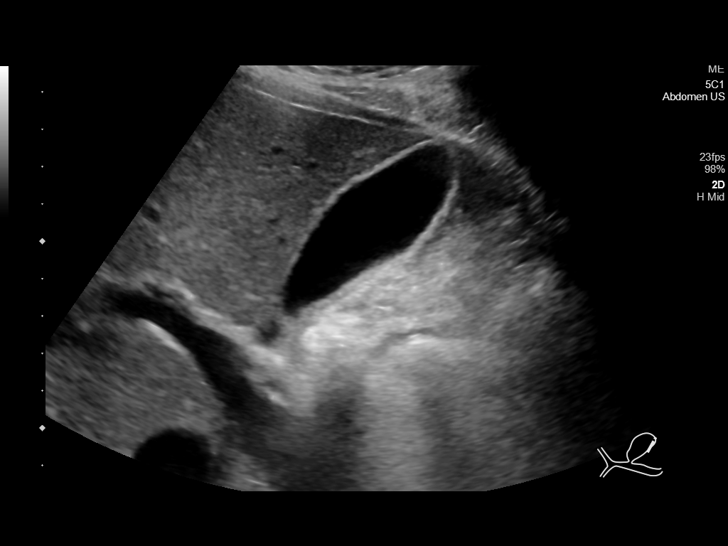
[im 5/49]
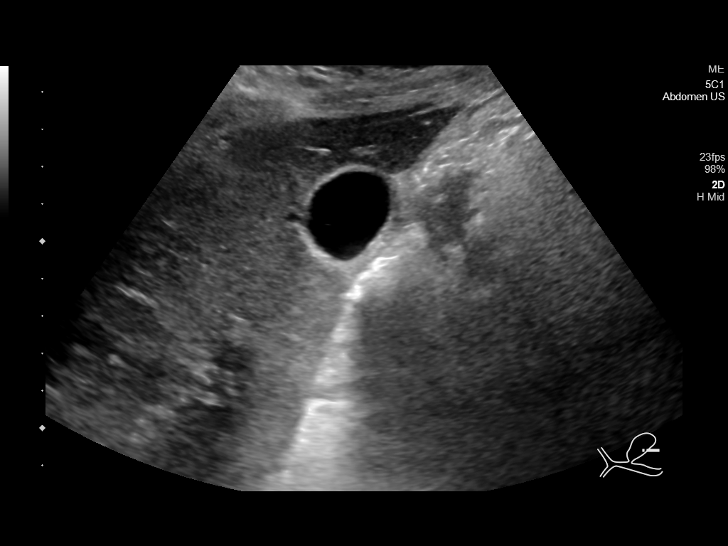
[im 9/49]
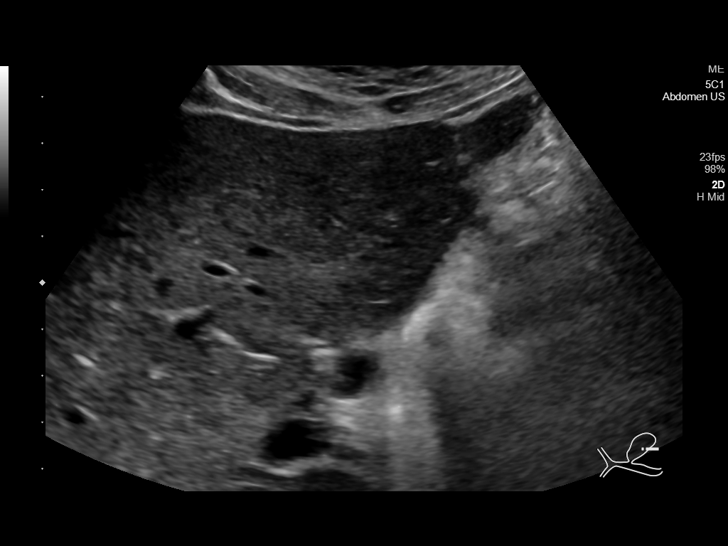
[im 13/49]
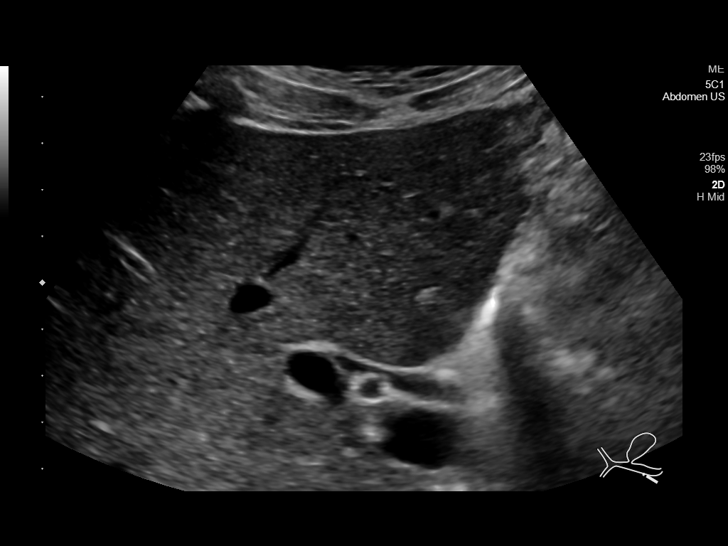
[im 17/49]
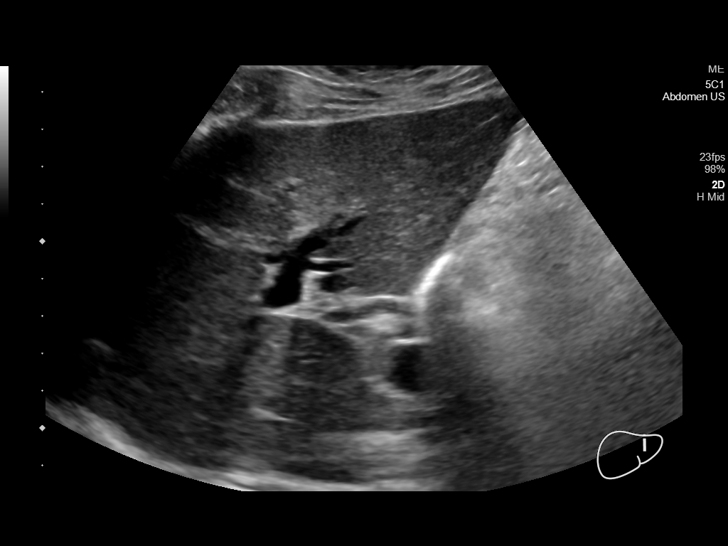
[im 19/49]
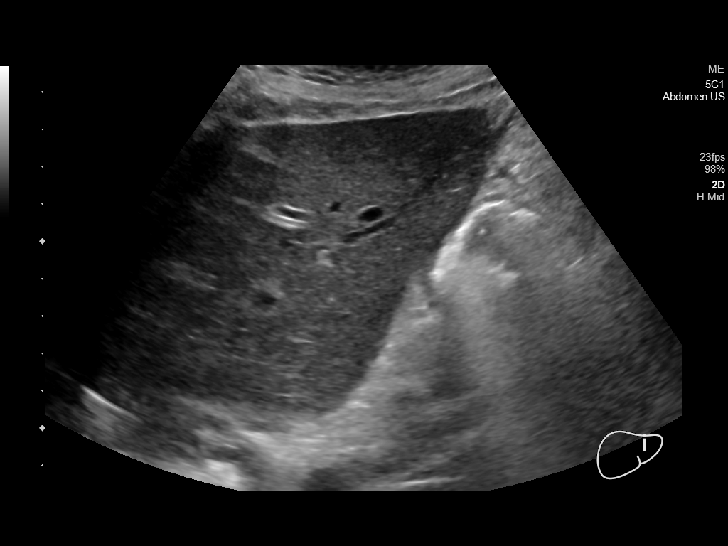
[im 23/49]
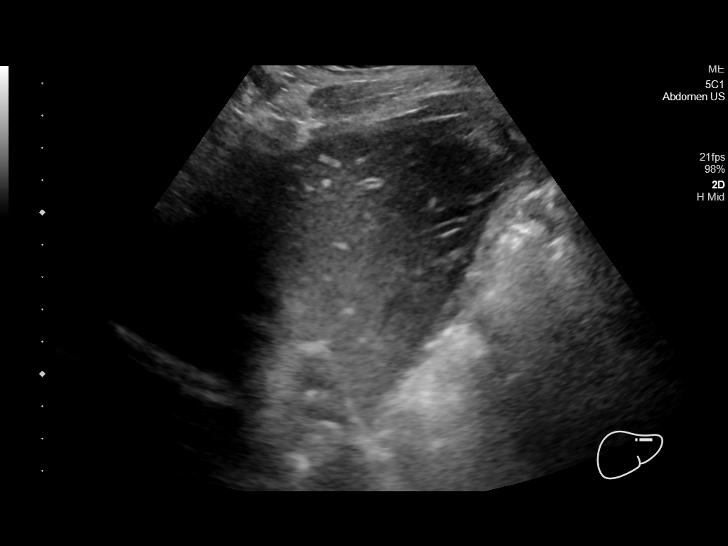
[im 27/49]
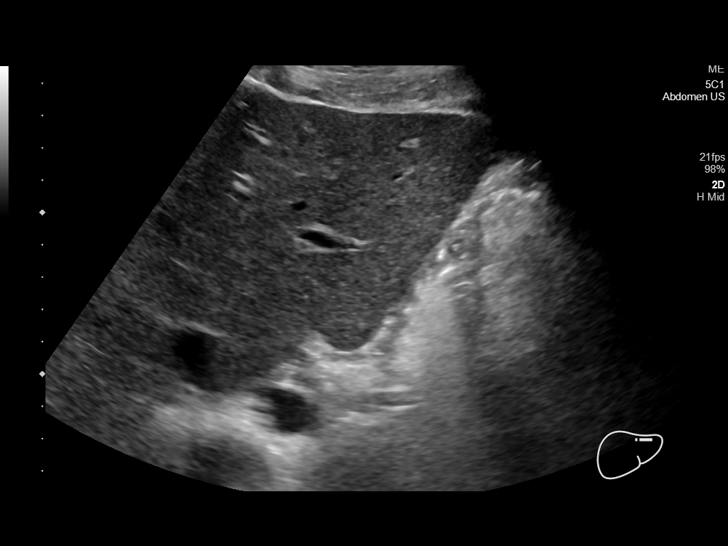
[im 31/49]
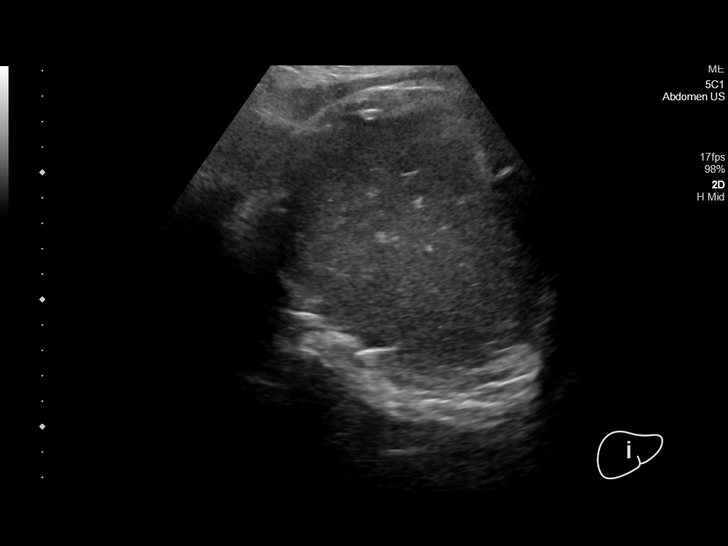
[im 33/49]
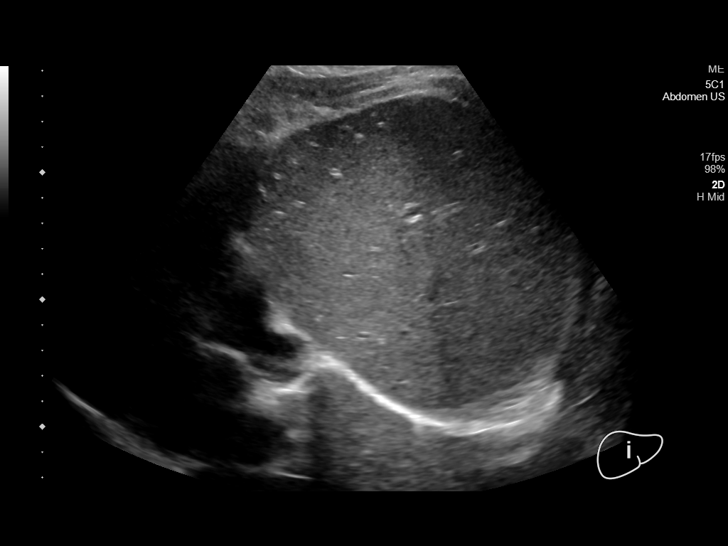
[im 37/49]
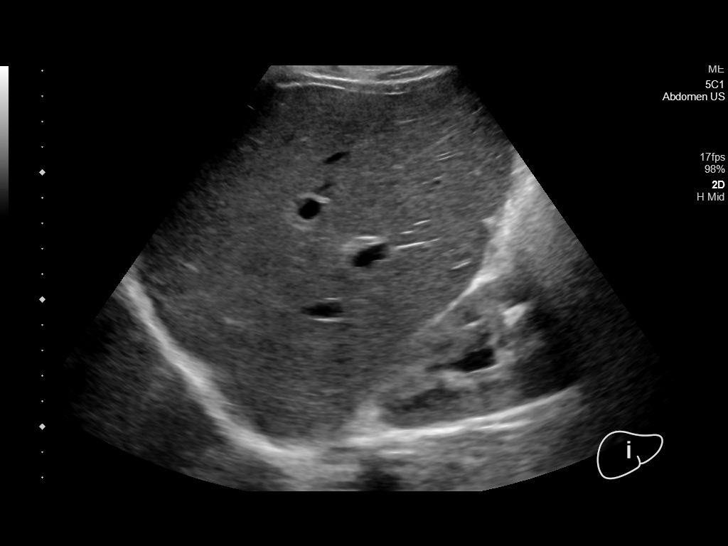
[im 41/49]
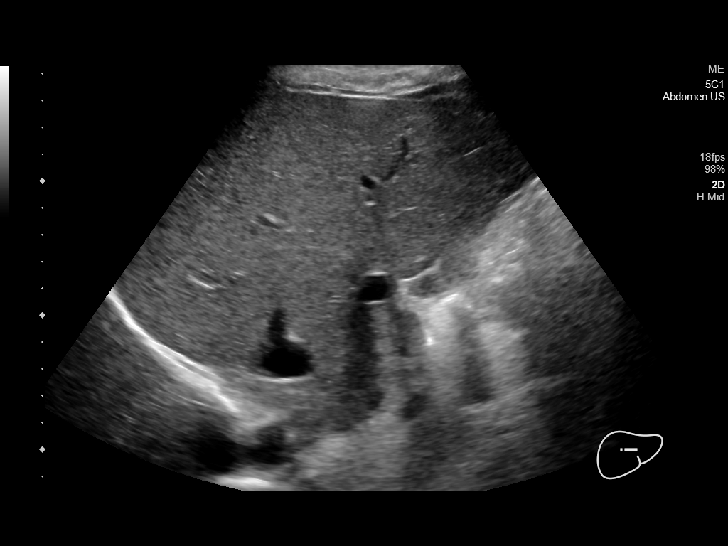
[im 45/49]
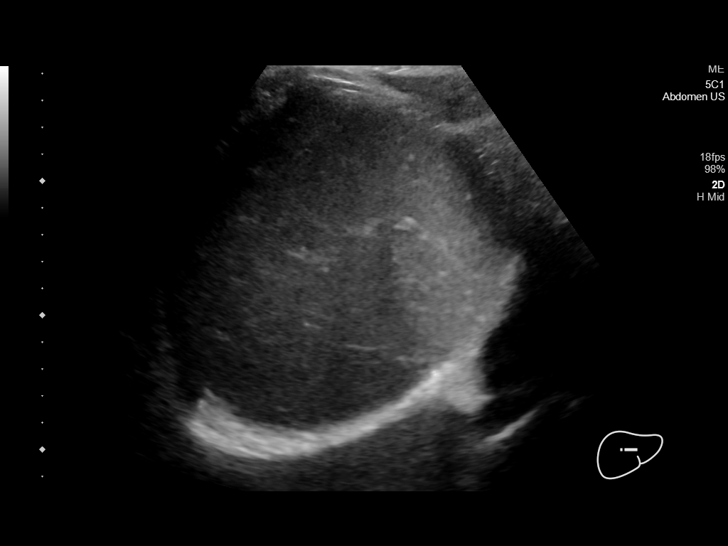
[im 49/49]
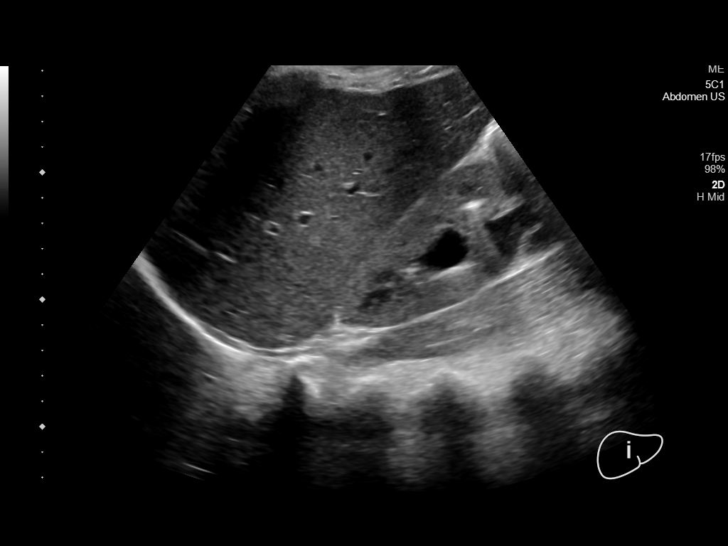

[14 of 25 positions shown; findings below may reference images not displayed]

FINDINGS: Gallbladder:

No gallstones or wall thickening visualized. No sonographic Murphy
sign noted by sonographer.

Common bile duct:

Diameter: 3 mm, within normal limits.

Liver:

No focal lesion identified. Within normal limits in parenchymal
echogenicity. Portal vein is patent on color Doppler imaging with
normal direction of blood flow towards the liver.

Other: None.
IMPRESSION: Negative.  No hepatobiliary abnormality identified.
